# Patient Record
Sex: Female | Born: 1950 | Race: White | Hispanic: No | Marital: Single | Smoking: Never smoker
Health system: Southern US, Community
[De-identification: ages and names within clinical notes are randomized; demographics above are authoritative.]

## PROBLEM LIST (undated history)

## (undated) DIAGNOSIS — Z8489 Family history of other specified conditions: Secondary | ICD-10-CM

## (undated) DIAGNOSIS — R112 Nausea with vomiting, unspecified: Secondary | ICD-10-CM

## (undated) DIAGNOSIS — I10 Essential (primary) hypertension: Secondary | ICD-10-CM

## (undated) DIAGNOSIS — Z87442 Personal history of urinary calculi: Secondary | ICD-10-CM

## (undated) DIAGNOSIS — T8859XA Other complications of anesthesia, initial encounter: Secondary | ICD-10-CM

## (undated) DIAGNOSIS — L409 Psoriasis, unspecified: Secondary | ICD-10-CM

## (undated) HISTORY — DX: Psoriasis, unspecified: L40.9

---

## 1975-07-11 HISTORY — PX: TUBAL LIGATION: SHX77

## 1994-07-10 HISTORY — PX: APPENDECTOMY: SHX54

## 1997-07-10 HISTORY — PX: CHOLECYSTECTOMY: SHX55

## 1998-04-09 ENCOUNTER — Ambulatory Visit (HOSPITAL_COMMUNITY): Admission: RE | Admit: 1998-04-09 | Discharge: 1998-04-09 | Payer: Self-pay | Admitting: Dermatology

## 1998-04-09 ENCOUNTER — Encounter: Payer: Self-pay | Admitting: Dermatology

## 2002-03-11 ENCOUNTER — Ambulatory Visit (HOSPITAL_COMMUNITY): Admission: RE | Admit: 2002-03-11 | Discharge: 2002-03-11 | Payer: Self-pay | Admitting: Dermatology

## 2002-03-11 ENCOUNTER — Encounter (INDEPENDENT_AMBULATORY_CARE_PROVIDER_SITE_OTHER): Payer: Self-pay | Admitting: Specialist

## 2002-03-11 ENCOUNTER — Encounter: Payer: Self-pay | Admitting: Dermatology

## 2005-08-22 ENCOUNTER — Ambulatory Visit (HOSPITAL_COMMUNITY): Admission: RE | Admit: 2005-08-22 | Discharge: 2005-08-22 | Payer: Self-pay | Admitting: Dermatology

## 2005-08-22 ENCOUNTER — Encounter (INDEPENDENT_AMBULATORY_CARE_PROVIDER_SITE_OTHER): Payer: Self-pay | Admitting: Specialist

## 2005-08-26 ENCOUNTER — Emergency Department (HOSPITAL_COMMUNITY): Admission: EM | Admit: 2005-08-26 | Discharge: 2005-08-26 | Payer: Self-pay | Admitting: Emergency Medicine

## 2005-09-01 ENCOUNTER — Ambulatory Visit (HOSPITAL_COMMUNITY): Admission: RE | Admit: 2005-09-01 | Discharge: 2005-09-02 | Payer: Self-pay | Admitting: Surgery

## 2005-09-01 ENCOUNTER — Encounter (INDEPENDENT_AMBULATORY_CARE_PROVIDER_SITE_OTHER): Payer: Self-pay | Admitting: Specialist

## 2008-07-10 HISTORY — PX: MENISCUS REPAIR: SHX5179

## 2009-05-24 ENCOUNTER — Encounter: Admission: RE | Admit: 2009-05-24 | Discharge: 2009-05-24 | Payer: Self-pay | Admitting: Family Medicine

## 2009-10-19 ENCOUNTER — Ambulatory Visit (HOSPITAL_COMMUNITY): Admission: RE | Admit: 2009-10-19 | Discharge: 2009-10-19 | Payer: Self-pay | Admitting: General Practice

## 2010-09-28 LAB — PROTIME-INR: Prothrombin Time: 13 seconds (ref 11.6–15.2)

## 2010-09-28 LAB — CBC
HCT: 42.4 % (ref 36.0–46.0)
Platelets: 163 10*3/uL (ref 150–400)
RDW: 15.5 % (ref 11.5–15.5)

## 2010-09-28 LAB — APTT: aPTT: 31 seconds (ref 24–37)

## 2010-11-25 NOTE — Op Note (Signed)
NAMEABREE, ROMICK              ACCOUNT NO.:  [id number]   MEDICAL RECORD NO.:  [id number]          PATIENT TYPE:  OIB   LOCATION:  5703                         FACILITY:  [facility]   PHYSICIAN:  Wilmon Arms. Corliss Skains, M.D. DATE OF BIRTH:  June 13, 1951   DATE OF PROCEDURE:  09/01/2005  DATE OF DISCHARGE:  09/02/2005                                 OPERATIVE REPORT   PREOPERATIVE DIAGNOSIS:  Chronic calculus cholecystitis.   POSTOPERATIVE DIAGNOSIS:  Chronic calculus cholecystitis.   PROCEDURE PERFORMED:  Laparoscopic cholecystectomy and intraoperative  cholangiogram.   SURGEON:  Wilmon Arms. Tsuei, MD.   ANESTHESIA:  General endotracheal.   INDICATIONS:  The patient is a 60 year old female with a past medical  history significant for psoriasis on methotrexate treatment.  She presents  with a right upper quadrant pain after a recent liver biopsy.  This was  severe and she presented to the emergency department.  A CT scan showed no  evidence of bleeding from the liver biopsy.  She did have multiple calcified  gallstones with a distended gallbladder.  There was no evidence of  cholecystitis.  The patient was then referred for evaluation.  She was seen  earlier this week and was fairly uncomfortable with right upper quadrant  pain.  She has also had some diarrhea.  I started her on oral antibiotics  and posted her for surgery today.   DESCRIPTION OF PROCEDURE:  The patient was brought to the operating room and  placed in the supine position on the operating room table.  After an  adequate level of general endotracheal anesthesia was obtained, the  patient's abdomen was prepped with Betadine and draped in a sterile fashion.  An area below her umbilicus was infiltrated with quarter-percent Marcaine,  and a curvilinear incision was made.  Dissection was carried down to the  fascia, which was opened vertically.  The peritoneal cavity was entered.  A  #0 Vicryl pursestring suture was placed  around the opening.  This was used  to secure the [facility] cannula, and pneumoperitoneum was obtained by  insufflating with CO2 gas maintaining a maximum pressure of 15 mmHg.  The  patient was rotated into the reverse Trendelenburg position slightly to her  left.  A 10-mm port was placed in the subxiphoid position and two 5-mm ports  in the right upper quadrant.  The gallbladder was grasped with a clamp and  elevated up over the edge of the liver.  Some adhesions were taken down.  The peritoneum at the hilum of the gallbladder was opened.  The cystic duct  was circumferentially dissected and ligated with a clip distally.  A small  opening was created on the cystic duct, and a cholangiogram catheter was  inserted through a stab incision into the cystic duct.  This was secured  with a clip.  Our initial cholangiogram showed a small filling defect  floating in the proximal biliary system.  This was round and floated fairly  quickly.  This was felt to be an air bubble.  We then repeated a  cholangiogram, and a filling defect was no longer  noted.  The cholangiogram  catheter was removed, and the cystic duct was ligated with clips and  divided.  The cystic artery was also ligated with clips and divided.  An  anterior branch was ligated with clips and divided.  Cautery was then used  to remove the gallbladder from the liver bed.  The gallbladder was placed in  an EndoCatch sac.  Hemostasis was obtained with cautery.  The gallbladder  was removed through the umbilical incision.  The pursestring suture was used  to close the fascia.  We re-inspected the right upper quadrant and no  bleeding was noted.  The pneumoperitoneum was released, the ports were  removed under direct vision.  A #4-0 Monocryl was used to  close the skin in a subcuticular fashion.  Steri-Strips and clean dressings  were applied.  The patient was then extubated and brought to the recovery  room in stable condition.  All sponge,  instrument, and needle counts were  correct.      Wilmon Arms. Tsuei, M.D.  Electronically Signed     MKT/MEDQ  D:  09/01/2005  T:  09/02/2005  Job:  [id number]

## 2011-03-14 ENCOUNTER — Other Ambulatory Visit: Payer: Self-pay | Admitting: Dermatology

## 2012-10-29 ENCOUNTER — Other Ambulatory Visit: Payer: Self-pay | Admitting: Gastroenterology

## 2013-04-18 ENCOUNTER — Other Ambulatory Visit: Payer: Self-pay | Admitting: Family Medicine

## 2013-04-18 DIAGNOSIS — R9389 Abnormal findings on diagnostic imaging of other specified body structures: Secondary | ICD-10-CM

## 2013-04-21 ENCOUNTER — Ambulatory Visit
Admission: RE | Admit: 2013-04-21 | Discharge: 2013-04-21 | Disposition: A | Discharge status: Home / Self Care | Payer: 59 | Source: Ambulatory Visit | Attending: Family Medicine | Admitting: Family Medicine

## 2013-04-21 DIAGNOSIS — R9389 Abnormal findings on diagnostic imaging of other specified body structures: Secondary | ICD-10-CM

## 2013-04-21 MED ORDER — IOHEXOL 300 MG/ML  SOLN
75.0000 mL | Freq: Once | INTRAMUSCULAR | Status: AC | PRN
  Administered 2013-04-21: 75 mL via INTRAVENOUS

## 2014-08-17 ENCOUNTER — Other Ambulatory Visit: Payer: Self-pay | Admitting: Obstetrics and Gynecology

## 2014-08-18 LAB — CYTOLOGY - PAP

## 2015-02-24 ENCOUNTER — Other Ambulatory Visit: Payer: Self-pay | Admitting: Family Medicine

## 2015-02-24 DIAGNOSIS — R1013 Epigastric pain: Secondary | ICD-10-CM

## 2015-03-05 ENCOUNTER — Ambulatory Visit
Admission: RE | Admit: 2015-03-05 | Discharge: 2015-03-05 | Disposition: A | Discharge status: Home / Self Care | Payer: 59 | Source: Ambulatory Visit | Attending: Family Medicine | Admitting: Family Medicine

## 2015-03-05 DIAGNOSIS — R1013 Epigastric pain: Secondary | ICD-10-CM

## 2015-03-05 MED ORDER — IOPAMIDOL (ISOVUE-300) INJECTION 61%
100.0000 mL | Freq: Once | INTRAVENOUS | Status: AC | PRN
  Administered 2015-03-05: 100 mL via INTRAVENOUS

## 2015-09-17 ENCOUNTER — Other Ambulatory Visit: Payer: Self-pay | Admitting: Family Medicine

## 2015-09-17 DIAGNOSIS — R109 Unspecified abdominal pain: Secondary | ICD-10-CM

## 2015-09-21 ENCOUNTER — Other Ambulatory Visit: Payer: Self-pay | Admitting: Family Medicine

## 2015-09-21 DIAGNOSIS — R109 Unspecified abdominal pain: Secondary | ICD-10-CM

## 2015-09-29 ENCOUNTER — Ambulatory Visit
Admission: RE | Admit: 2015-09-29 | Discharge: 2015-09-29 | Disposition: A | Discharge status: Home / Self Care | Payer: 59 | Source: Ambulatory Visit | Attending: Family Medicine | Admitting: Family Medicine

## 2015-09-29 ENCOUNTER — Ambulatory Visit
Admission: RE | Admit: 2015-09-29 | Discharge: 2015-09-29 | Disposition: A | Discharge status: Home / Self Care | Payer: Medicare Other | Source: Ambulatory Visit | Attending: Family Medicine | Admitting: Family Medicine

## 2015-09-29 DIAGNOSIS — R109 Unspecified abdominal pain: Secondary | ICD-10-CM

## 2017-07-31 IMAGING — US US ABDOMEN COMPLETE
1 series · 14 of 25 positions shown · non-contrast
Comparison: 03/05/2015 CT with contrast

CLINICAL DATA: Acute left flank pain, prior cholecystectomy and
appendectomy.

EXAM:
ABDOMEN ULTRASOUND COMPLETE

[Series 1: us abdomen complete · 0.29mm/px · 14 of 74 slices shown]
[im 1/74]
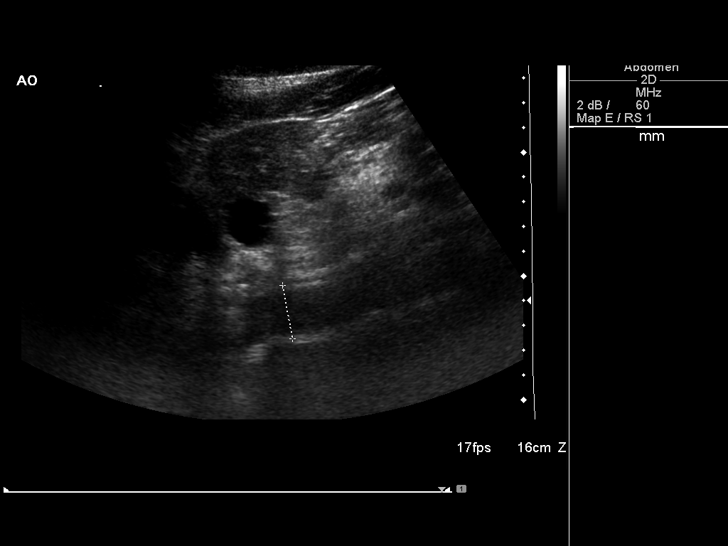
[im 7/74]
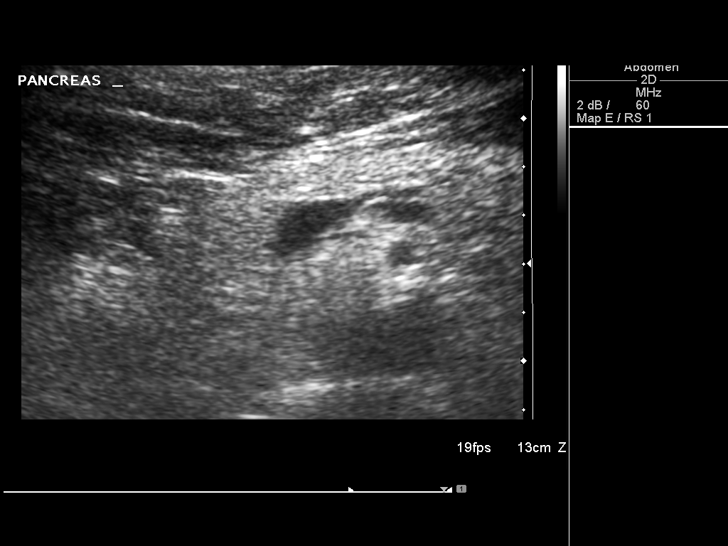
[im 13/74]
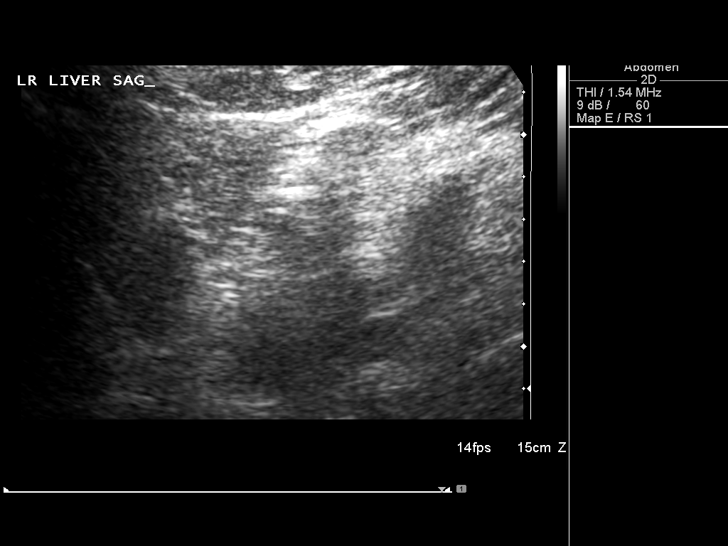
[im 19/74]
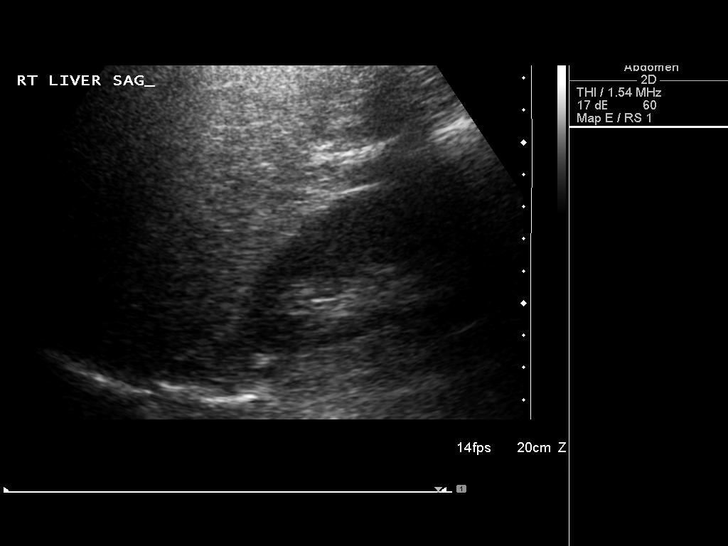
[im 25/74]
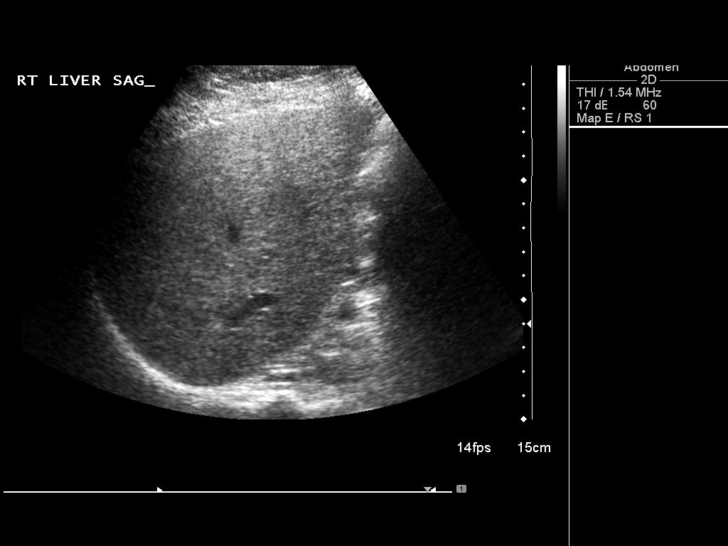
[im 28/74]
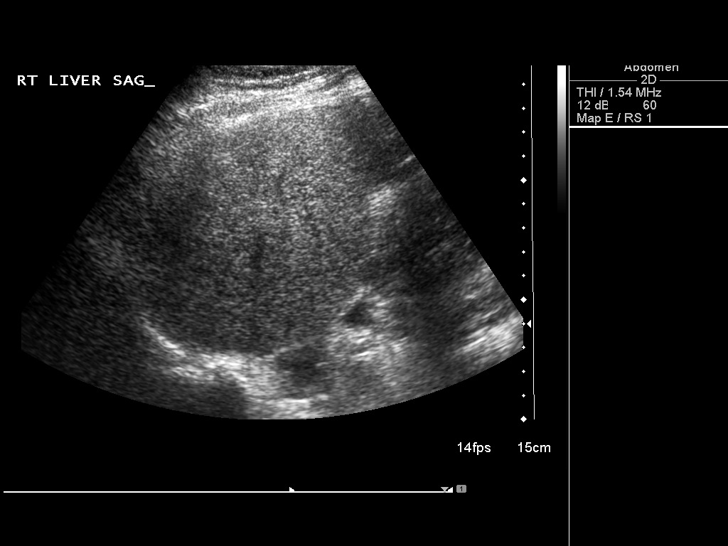
[im 34/74]
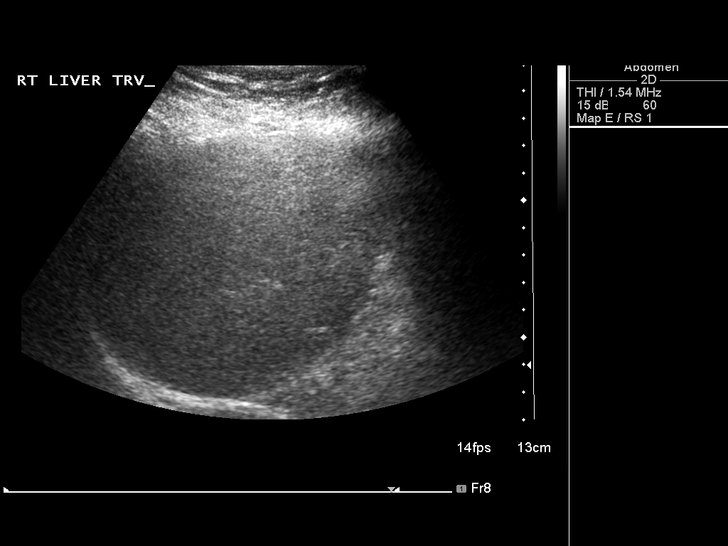
[im 40/74]
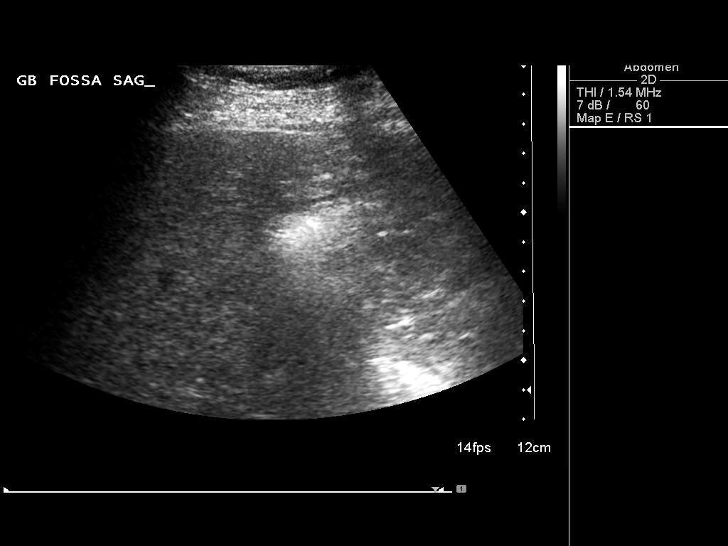
[im 46/74]
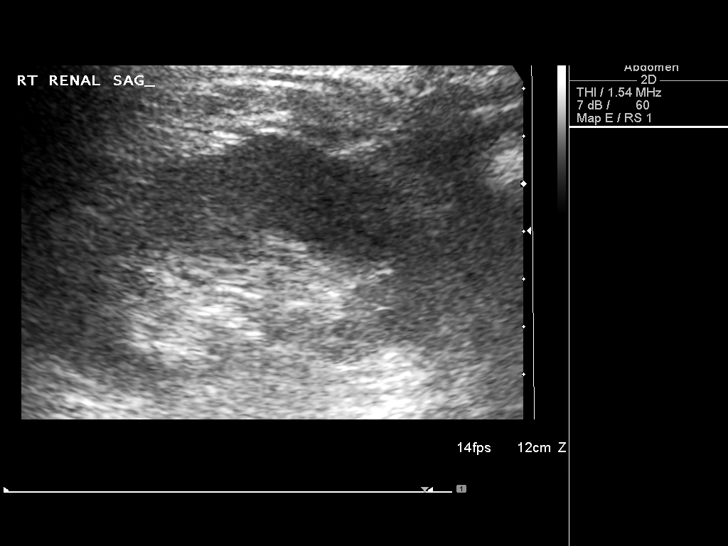
[im 49/74]
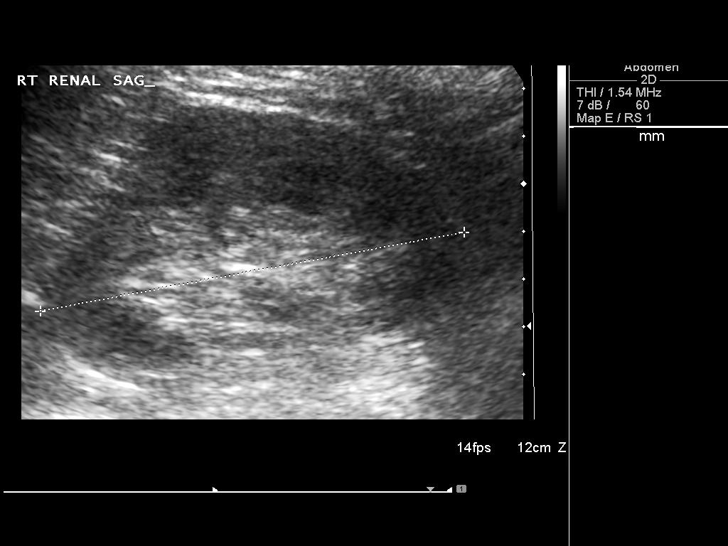
[im 55/74]
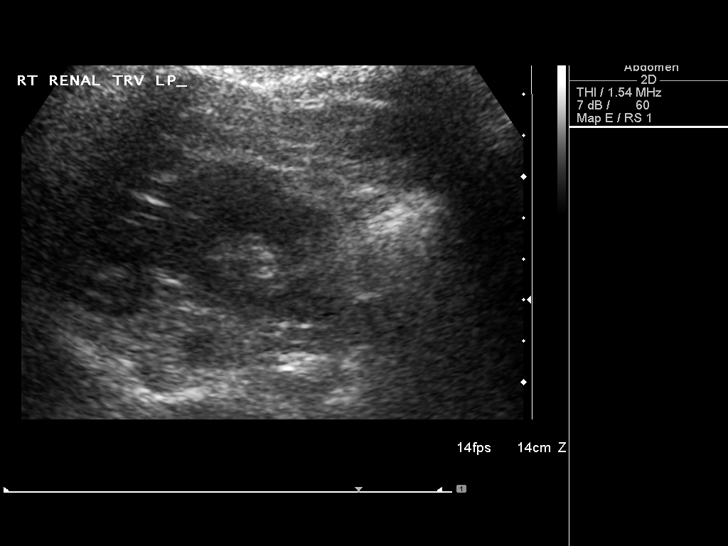
[im 61/74]
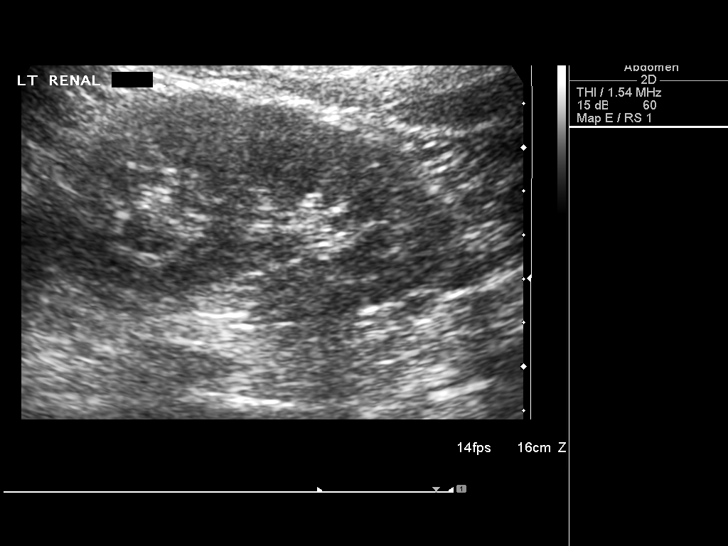
[im 67/74]
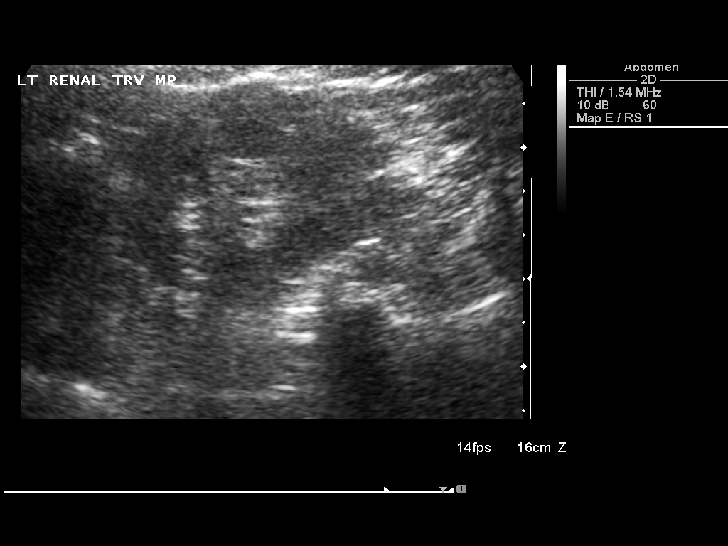
[im 74/74]
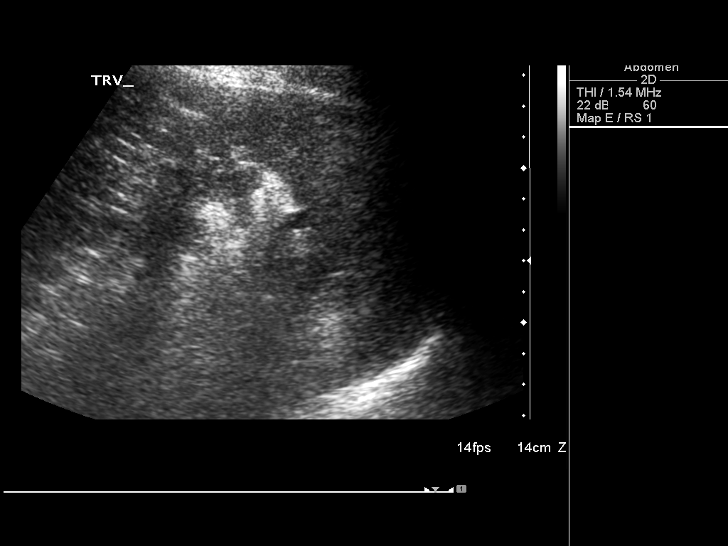

[14 of 25 positions shown; findings below may reference images not displayed]

FINDINGS: Gallbladder: Surgically absent

Common bile duct: Diameter: 5 mm

Liver: Mildly heterogeneous echogenicity without definite focal
abnormality or intrahepatic biliary dilatation. Left hepatic cysts
in the lateral segment measures 18 x 21 x 21 mm, correlates with the
prior CT. No other hepatic abnormality.

IVC: No abnormality visualized.

Pancreas: Visualized portion unremarkable.

Spleen: Size and appearance within normal limits.

Right Kidney: Length: 9.1 cm. Echogenicity within normal limits. No
mass or hydronephrosis visualized.

Left Kidney: Length: 9.6 cm. Echogenicity within normal limits. No
mass or hydronephrosis visualized.

Abdominal aorta: No aneurysm visualized.

Other findings: No free fluid
IMPRESSION: Prior cholecystectomy.  No acute finding or biliary dilatation.

2.1 cm left hepatic cyst

## 2017-07-31 IMAGING — US US PELVIS COMPLETE
1 series · 13 of 25 positions shown · non-contrast
Comparison: None in PACs

CLINICAL DATA: One week history of left flank pain, protein urea.



[Series 1: us pelvis complete · 0.30mm/px · 13 of 61 slices shown]
[im 1/61]
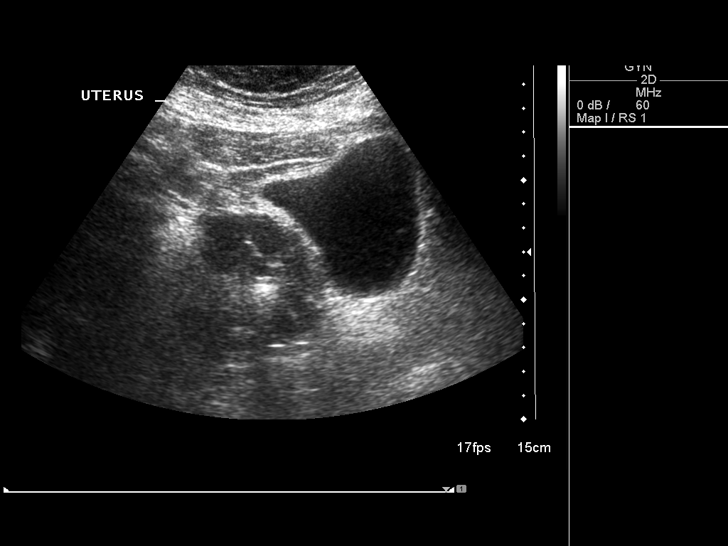
[im 6/61]
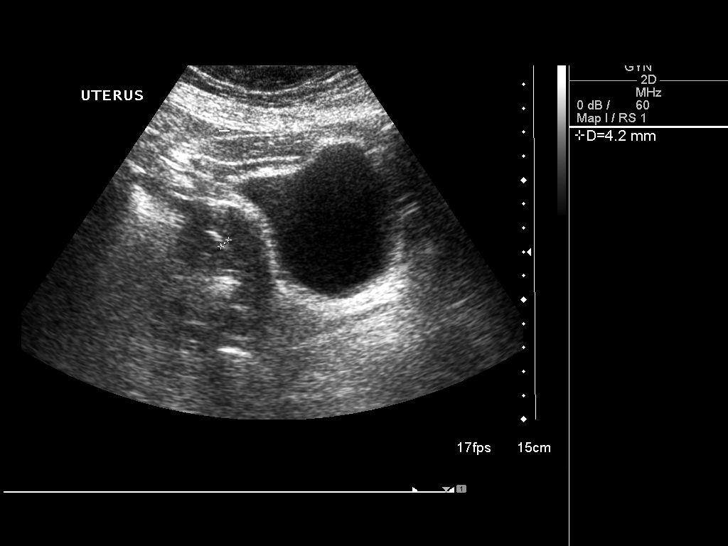
[im 11/61]
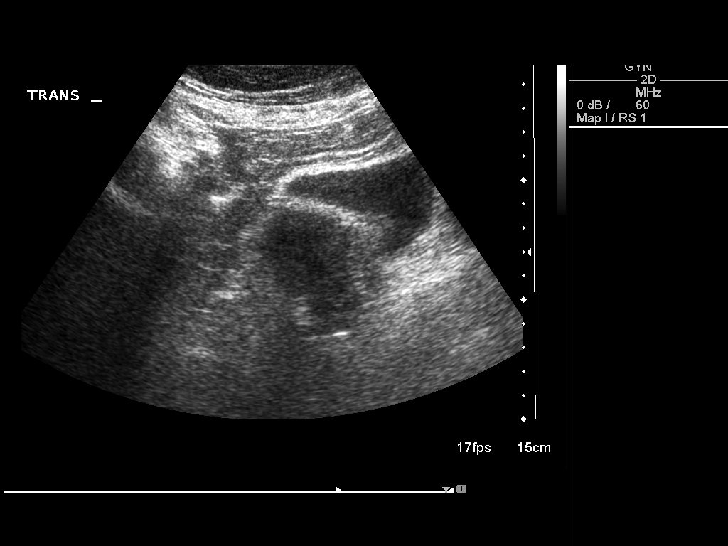
[im 16/61]
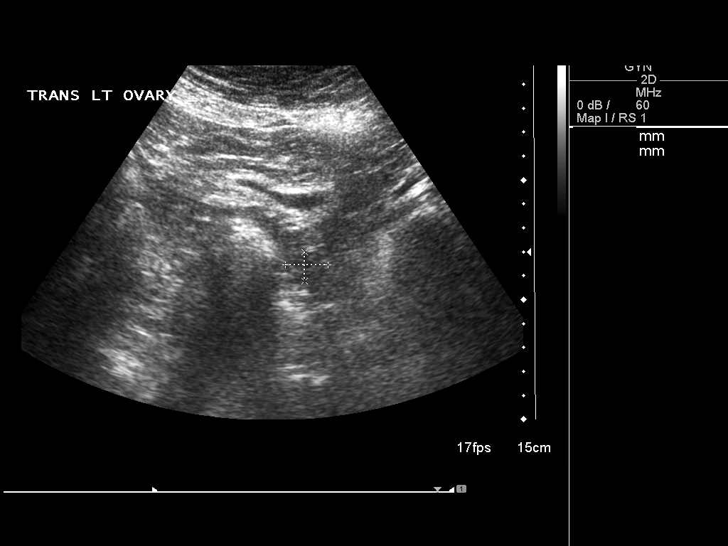
[im 21/61]
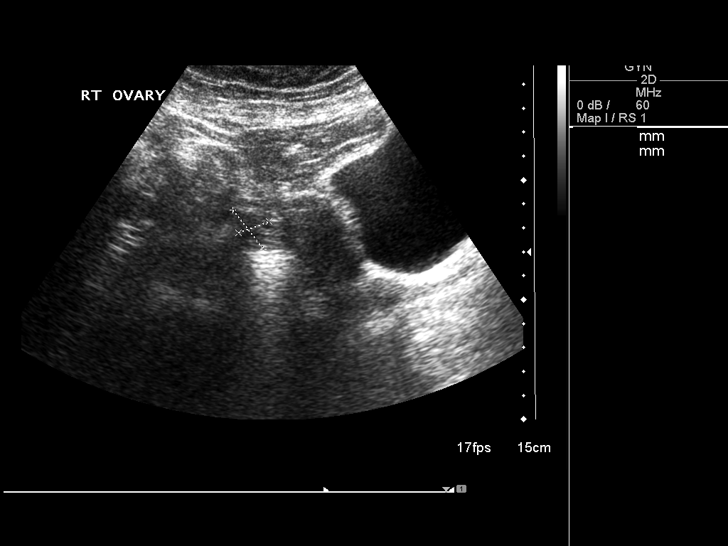
[im 26/61]
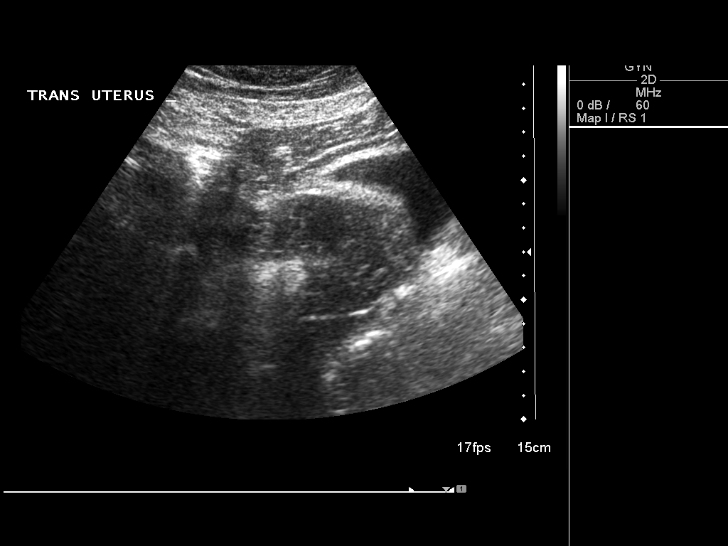
[im 31/61]
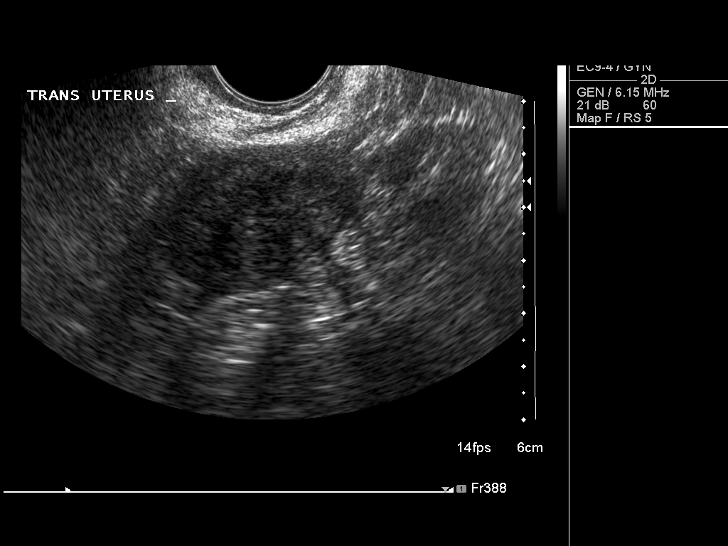
[im 36/61]
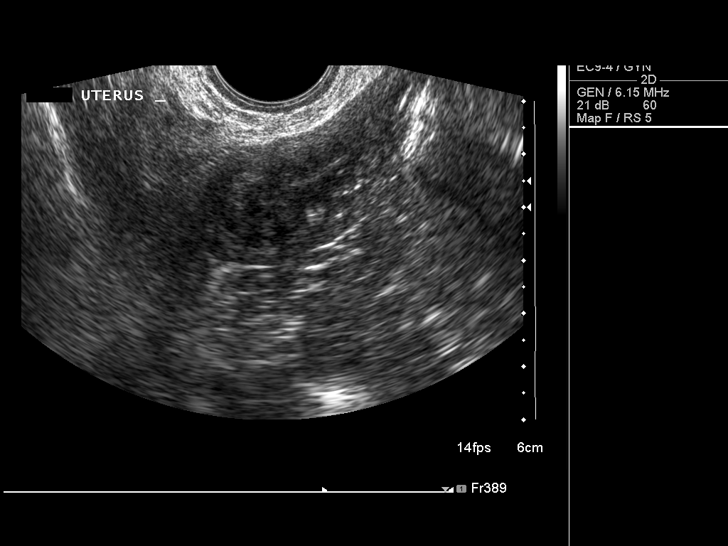
[im 41/61]
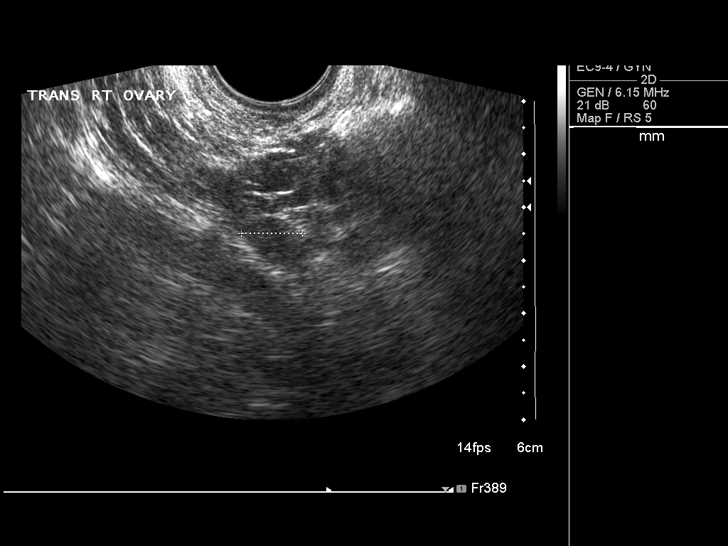
[im 46/61]
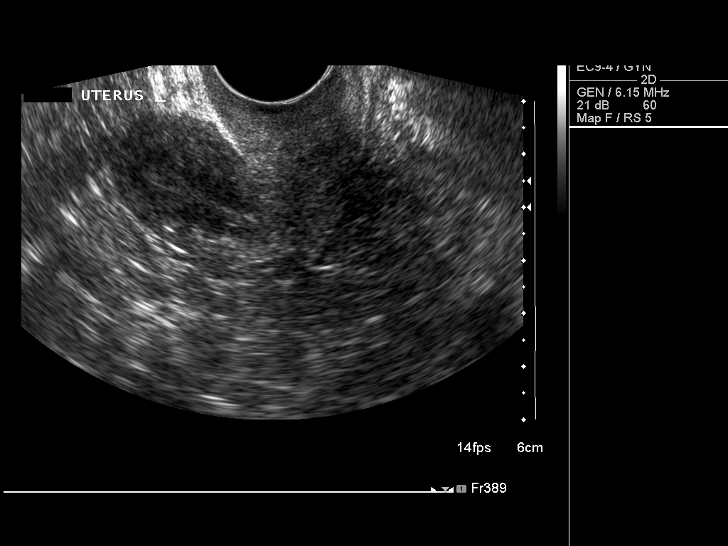
[im 51/61]
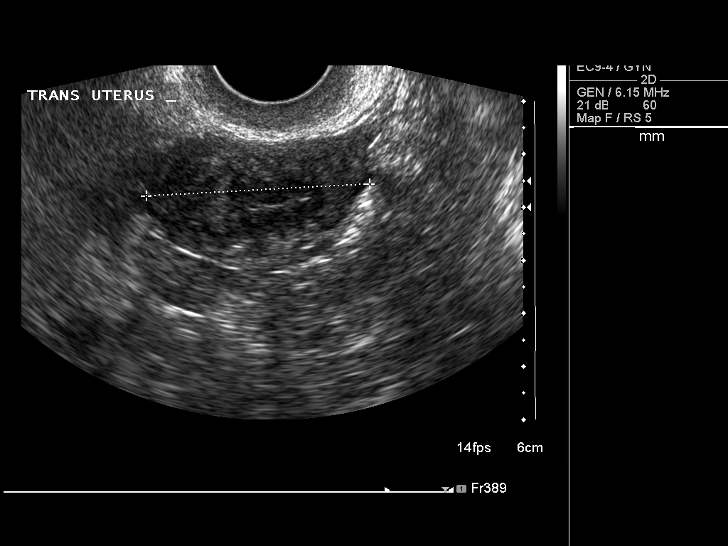
[im 56/61]
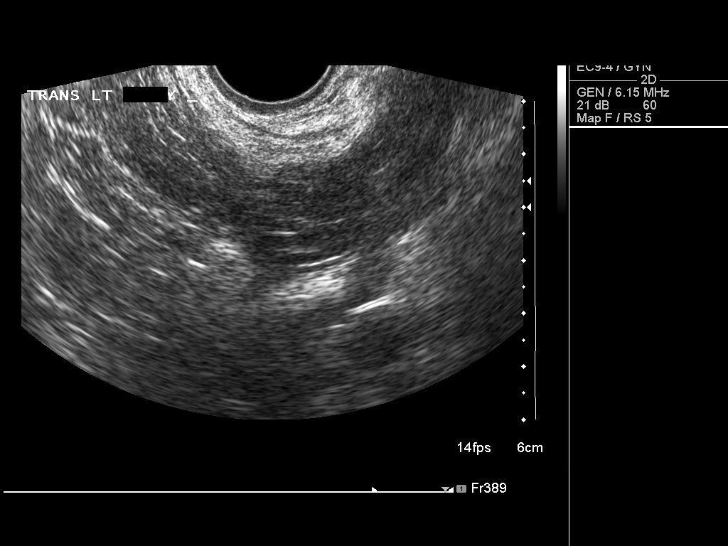
[im 61/61]
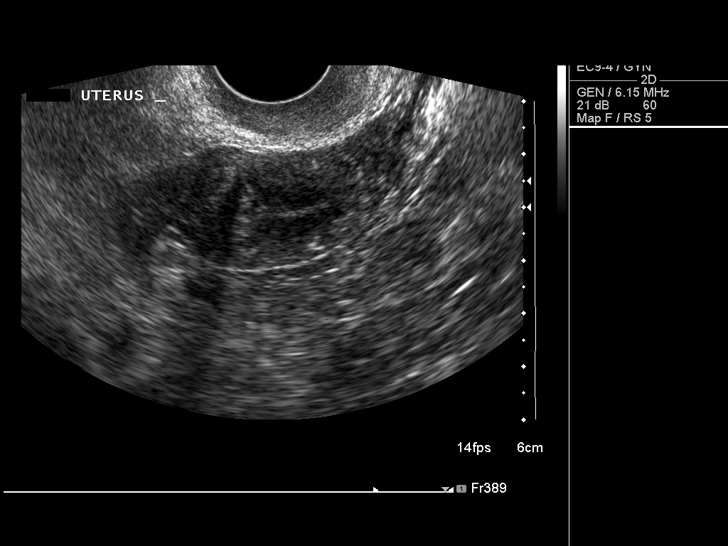

[13 of 25 positions shown; findings below may reference images not displayed]

FINDINGS: Uterus

Measurements: 6.8 x 3.0 x 4.2 cm. There is a posterior right-sided
fundal fibroid measuring 1.7 cm in greatest dimension.

Endometrium

Thickness: 4 mm.  No focal abnormality visualized.

Right ovary

Measurements: 1.6 x 0.8 x 1.2 cm. Normal appearance/no adnexal mass.

Left ovary

Measurements: 1.3 x 0.7 x 1 cm. Normal appearance/no adnexal mass.

Other findings

There is no free pelvic fluid. The urinary bladder is unremarkable
where visualized.
IMPRESSION: 1. 1.7 cm maximal diameter right-sided posterior fundal fibroid. The
endometrial stripe is normal.
2. Normal appearance of the ovaries.  There is no free pelvic fluid.

## 2019-09-25 ENCOUNTER — Other Ambulatory Visit: Payer: Self-pay | Admitting: Dermatology

## 2019-09-27 LAB — QUANTIFERON-TB GOLD PLUS
Mitogen-NIL: 6.64 IU/mL
NIL: 0.09 IU/mL
QuantiFERON-TB Gold Plus: NEGATIVE
TB1-NIL: <0 IU/mL
TB2-NIL: <0 IU/mL

## 2019-10-06 ENCOUNTER — Telehealth: Payer: Self-pay | Admitting: Dermatology

## 2019-10-06 NOTE — Telephone Encounter (Signed)
Patient was given TB Gold test results (Negative), she is now able to make nurse visit to get her Ilumya injection. Nurse visit was made for this Wednesday at 2:45 the [place] has been shipped to the office.

## 2019-10-06 NOTE — Telephone Encounter (Signed)
Patient calling for TB test results and next steps for getting Ilumya Injection.

## 2019-10-08 ENCOUNTER — Other Ambulatory Visit: Payer: Self-pay

## 2019-10-08 ENCOUNTER — Ambulatory Visit (INDEPENDENT_AMBULATORY_CARE_PROVIDER_SITE_OTHER): Payer: Medicare Other | Admitting: *Deleted

## 2019-10-08 DIAGNOSIS — L409 Psoriasis, unspecified: Secondary | ICD-10-CM

## 2019-10-08 MED ORDER — TILDRAKIZUMAB-ASMN 100 MG/ML ~~LOC~~ SOSY
100.0000 mg | PREFILLED_SYRINGE | Freq: Once | SUBCUTANEOUS | Status: AC
  Administered 2019-10-08: 100 mg via SUBCUTANEOUS

## 2019-10-08 NOTE — Progress Notes (Signed)
Gave injection Left abdomen

## 2019-10-14 ENCOUNTER — Encounter: Payer: Self-pay | Admitting: *Deleted

## 2019-10-15 NOTE — Progress Notes (Signed)
Went back into chart per Juliann Pulse to add the J-code because she supposedly fixed the error.. however it still rejected J-code  [id number]

## 2019-12-02 ENCOUNTER — Telehealth: Payer: Self-pay | Admitting: Dermatology

## 2019-12-02 NOTE — Telephone Encounter (Signed)
Phone call to Katie Tanner to schedule delivery of the patient's Ilumya injection.  Per [place] they will get the patient's medication delivered tomorrow 12/03/2019.

## 2019-12-02 NOTE — Telephone Encounter (Signed)
Pharmacy is calling to set up shipment of her Ilumiya prescription.  (Chart # [id number])

## 2019-12-11 ENCOUNTER — Ambulatory Visit (INDEPENDENT_AMBULATORY_CARE_PROVIDER_SITE_OTHER): Payer: Medicare Other

## 2019-12-11 ENCOUNTER — Other Ambulatory Visit: Payer: Self-pay

## 2019-12-11 DIAGNOSIS — L409 Psoriasis, unspecified: Secondary | ICD-10-CM

## 2019-12-11 MED ORDER — TILDRAKIZUMAB-ASMN 100 MG/ML ~~LOC~~ SOSY
100.0000 mg | PREFILLED_SYRINGE | Freq: Once | SUBCUTANEOUS | Status: AC
  Administered 2019-12-11: 100 mg via SUBCUTANEOUS

## 2019-12-11 NOTE — Progress Notes (Signed)
Right abdomen injection site ilumya.

## 2019-12-18 ENCOUNTER — Other Ambulatory Visit: Payer: Self-pay

## 2019-12-18 ENCOUNTER — Ambulatory Visit (INDEPENDENT_AMBULATORY_CARE_PROVIDER_SITE_OTHER): Payer: Medicare Other | Admitting: Otolaryngology

## 2019-12-18 ENCOUNTER — Encounter (INDEPENDENT_AMBULATORY_CARE_PROVIDER_SITE_OTHER): Payer: Self-pay | Admitting: Otolaryngology

## 2019-12-18 VITALS — Temp 97.3°F

## 2019-12-18 DIAGNOSIS — H903 Sensorineural hearing loss, bilateral: Secondary | ICD-10-CM

## 2019-12-18 NOTE — Progress Notes (Signed)
HPI: Katie Tanner is a 69 y.o. female who presents is referred by Dr. Drema Dallas for evaluation of hearing complaints.  Patient has noticed a gradual decline in her hearing.  Patient feels like she might be underwater or something is blocking her hearing in her ear canals.  She also complains of some mild tinnitus in both ears.  She also inquires about some small lumps on the inside of her left lower lip.Marland Kitchen  No past medical history on file. No past surgical history on file. Social History   Socioeconomic History   Marital status: Single    Spouse name: Not on file   Number of children: Not on file   Years of education: Not on file   Highest education level: Not on file  Occupational History   Not on file  Tobacco Use   Smoking status: Never Smoker   Smokeless tobacco: Never Used  Substance and Sexual Activity   Alcohol use: Not on file   Drug use: Not on file   Sexual activity: Not on file  Other Topics Concern   Not on file  Social History Narrative   Not on file   Social Determinants of Health   Financial Resource Strain:    Difficulty of Paying Living Expenses:   Food Insecurity:    Worried About [place] in the Last Year:    [place] in the Last Year:   Transportation Needs:    Film/video editor (Medical):    Lack of Transportation (Non-Medical):   Physical Activity:    Days of Exercise per Week:    Minutes of Exercise per Session:   Stress:    Feeling of Stress :   Social Connections:    Frequency of Communication with Friends and Family:    Frequency of Social Gatherings with Friends and Family:    Attends Religious Services:    Active Member of Clubs or Organizations:    Attends Archivist Meetings:    Marital Status:    No family history on file. No Known Allergies Prior to Admission medications   Not on File     Positive ROS: Otherwise negative  All other systems have been reviewed and were  otherwise negative with the exception of those mentioned in the HPI and as above.  Physical Exam: Constitutional: Alert, well-appearing, no acute distress Ears: External ears without lesions or tenderness.  Ear canals are clear bilaterally.  TMs are clear bilaterally with good mobility on pneumatic otoscopy. Nasal: External nose without lesions. Clear nasal passages Oral: She has some small nodules on the inside the left lower lip consistent with minor salivary glands or perhaps very small mucoceles.  These are soft to palpation and are not indurated.. Tongue and palate mucosa without lesions. Posterior oropharynx clear. Neck: No palpable adenopathy or masses. Respiratory: Breathing comfortably  Skin: No facial/neck lesions or rash noted.  Audiogram in the office today demonstrated bilateral sensorineural hearing loss in both ears.  On the right side her hearing range from 20 DB in the lower frequencies to 50 DB in the higher frequencies.  On the left side hearing started at 40 DB in the lower frequencies ranging up to 20 DB in the upper frequencies.  SRT's were symmetric in both ears at 30 dB bilaterally.  She had type a tympanograms bilaterally.  Procedures  Assessment: Bilateral sensorineural hearing loss.  Plan: I discussed with her that she would be a candidate for hearing aids and suggested  using either [facility] or [employer]. Concerning the nodules on the left lower lip these are consistent with minor salivary gland mucoceles and reassured her of normal evaluation otherwise.   Radene Journey, MD   CC:

## 2019-12-22 ENCOUNTER — Encounter (INDEPENDENT_AMBULATORY_CARE_PROVIDER_SITE_OTHER): Payer: Self-pay

## 2020-01-14 ENCOUNTER — Ambulatory Visit: Payer: Medicare Other | Admitting: Dermatology

## 2020-01-14 ENCOUNTER — Other Ambulatory Visit: Payer: Self-pay

## 2020-01-14 DIAGNOSIS — D485 Neoplasm of uncertain behavior of skin: Secondary | ICD-10-CM

## 2020-01-14 DIAGNOSIS — D225 Melanocytic nevi of trunk: Secondary | ICD-10-CM | POA: Diagnosis not present

## 2020-01-14 DIAGNOSIS — L570 Actinic keratosis: Secondary | ICD-10-CM | POA: Diagnosis not present

## 2020-01-14 DIAGNOSIS — D229 Melanocytic nevi, unspecified: Secondary | ICD-10-CM

## 2020-01-14 NOTE — Patient Instructions (Signed)

## 2020-02-02 ENCOUNTER — Encounter: Payer: Self-pay | Admitting: Dermatology

## 2020-02-02 NOTE — Progress Notes (Signed)
   Follow-Up Visit   Subjective  Katie Tanner is a 69 y.o. female who presents for the following: Skin Problem (Check spot on right cheek and a spot on the right breast. ).  Growth Location: Right breast Duration: Months Quality: Larger Associated Signs/Symptoms: Some itch Modifying Factors:  Severity:  Timing: Context:   Objective  Well appearing patient in no apparent distress; mood and affect are within normal limits.  All sun exposed areas plus back examined. Plus  Chest.   Assessment & Plan    Neoplasm of uncertain behavior of skin Right Breast  Skin / nail biopsy Type of biopsy: tangential   Informed consent: discussed and consent obtained   Timeout: patient name, date of birth, surgical site, and procedure verified   Procedure prep:  Patient was prepped and draped in usual sterile fashion Prep type:  Chlorhexidine Anesthesia: the lesion was anesthetized in a standard fashion   Anesthetic:  1% lidocaine w/ epinephrine 1-100,000 local infiltration Instrument used: flexible razor blade   Hemostasis achieved with: ferric subsulfate   Outcome: patient tolerated procedure well   Post-procedure details: wound care instructions given    Specimen 1 - Surgical pathology Differential Diagnosis: Rule out SK Check Margins: No  AK (actinic keratosis) Right Malar Cheek  Destruction of lesion - Right Malar Cheek  Destruction method: cryotherapy   Informed consent: discussed and consent obtained   Timeout:  patient name, date of birth, surgical site, and procedure verified Lesion destroyed using liquid nitrogen: Yes   Region frozen until ice ball extended beyond lesion: Yes   Cryotherapy cycles:  5 Outcome: patient tolerated procedure well with no complications    Nevus Mid Back  Annual skin examination     I, Lavonna Monarch, MD, have reviewed all documentation for this visit.  The documentation on 02/02/20 for the exam, diagnosis, procedures, and orders are  all accurate and complete.

## 2020-03-03 ENCOUNTER — Other Ambulatory Visit: Payer: Self-pay

## 2020-03-03 ENCOUNTER — Ambulatory Visit (INDEPENDENT_AMBULATORY_CARE_PROVIDER_SITE_OTHER): Payer: Medicare Other | Admitting: *Deleted

## 2020-03-03 DIAGNOSIS — L409 Psoriasis, unspecified: Secondary | ICD-10-CM | POA: Diagnosis not present

## 2020-03-03 MED ORDER — TILDRAKIZUMAB-ASMN 100 MG/ML ~~LOC~~ SOSY
100.0000 mg | PREFILLED_SYRINGE | Freq: Once | SUBCUTANEOUS | Status: AC
  Administered 2020-03-03: 100 mg via SUBCUTANEOUS

## 2020-03-03 NOTE — Progress Notes (Signed)
Here for Ilumya injection.  Injected 100 mg in the left abdomen. No concerns. Patient tolerated well.   EXP 02/2022 LOT # [id number] [place]- [phone]

## 2020-03-05 NOTE — Addendum Note (Signed)
Addended by: Ailene Rud on: 03/05/2020 08:35 AM   Modules accepted: Level of Service

## 2020-05-18 ENCOUNTER — Telehealth: Payer: Self-pay

## 2020-05-18 NOTE — Telephone Encounter (Signed)
Fax received from [place] stating that the patient's Ilumya will be shipped to our office.

## 2020-06-08 ENCOUNTER — Other Ambulatory Visit: Payer: Self-pay

## 2020-06-08 ENCOUNTER — Ambulatory Visit (INDEPENDENT_AMBULATORY_CARE_PROVIDER_SITE_OTHER): Payer: Medicare Other

## 2020-06-08 DIAGNOSIS — L409 Psoriasis, unspecified: Secondary | ICD-10-CM | POA: Diagnosis not present

## 2020-06-08 DIAGNOSIS — L400 Psoriasis vulgaris: Secondary | ICD-10-CM | POA: Diagnosis not present

## 2020-06-08 MED ORDER — TILDRAKIZUMAB-ASMN 100 MG/ML ~~LOC~~ SOSY
100.0000 mg | PREFILLED_SYRINGE | Freq: Once | SUBCUTANEOUS | Status: AC
  Administered 2020-06-08: 100 mg via SUBCUTANEOUS

## 2020-06-08 NOTE — Progress Notes (Signed)
Right abdomen tolerated well

## 2020-07-22 ENCOUNTER — Telehealth: Payer: Self-pay

## 2020-07-22 NOTE — Telephone Encounter (Signed)
Ilumya enrollment form filled out and faxed to [facility] support.

## 2020-08-13 ENCOUNTER — Telehealth: Payer: Self-pay

## 2020-08-13 NOTE — Telephone Encounter (Signed)
Phone call to patient to inform her that we need proof of her income and household size for her Ilumya support.  Patient states that she will mail her and her husbands income to [place].

## 2020-08-16 ENCOUNTER — Telehealth: Payer: Self-pay

## 2020-08-16 NOTE — Telephone Encounter (Signed)
Phone call to patient to get her email address so she can sign the Ilumya support paperwork.  Patient gave me her email address [email or web address]

## 2020-08-16 NOTE — Telephone Encounter (Signed)
Phone call from [employer] [facility] the [place] stating that for them to continue to process the patient's Ilumya they need a signature.  I explained to Jacqulynn Cadet the reason why I faxed the forms without the patient's signature was because according to [facility] support they didn't need a new signature for the patient.  Per Jacqulynn Cadet I was given the wrong information and the patient's signature is needed. Per Jacqulynn Cadet they can send an email to the patient for her to sign the paperwork. I place Jacqulynn Cadet on hold to call the patient and get her email address.  I gave Jacqulynn Cadet the email address.

## 2020-08-16 NOTE — Telephone Encounter (Signed)
Faxed income today to [place] support fax [phone]

## 2020-08-17 ENCOUNTER — Telehealth: Payer: Self-pay

## 2020-08-17 NOTE — Telephone Encounter (Signed)
Phone call from [facility] support stating the patient doesn't qualify for patient assistance due to her income.

## 2020-08-17 NOTE — Telephone Encounter (Signed)
Patient returning our call- informed patient that she doesn't qualify to patient assistance.  Patient understood

## 2020-08-23 ENCOUNTER — Other Ambulatory Visit: Payer: Self-pay | Admitting: Dermatology

## 2020-09-07 ENCOUNTER — Other Ambulatory Visit: Payer: Self-pay

## 2020-09-07 ENCOUNTER — Ambulatory Visit (INDEPENDENT_AMBULATORY_CARE_PROVIDER_SITE_OTHER): Payer: Medicare Other

## 2020-09-07 DIAGNOSIS — L400 Psoriasis vulgaris: Secondary | ICD-10-CM | POA: Diagnosis not present

## 2020-09-07 MED ORDER — TILDRAKIZUMAB-ASMN 100 MG/ML ~~LOC~~ SOSY
100.0000 mg | PREFILLED_SYRINGE | Freq: Once | SUBCUTANEOUS | Status: AC
  Administered 2020-09-07: 100 mg via SUBCUTANEOUS

## 2020-09-07 NOTE — Progress Notes (Signed)
Here for Ilumya injection right abdomen, patient tolerated well.

## 2020-11-29 ENCOUNTER — Telehealth: Payer: Self-pay | Admitting: Dermatology

## 2020-11-29 NOTE — Telephone Encounter (Signed)
[place] left a message on office voice mail that patient's medication is ready for delivery to th office and to call [phone] to set up delivery.

## 2020-12-02 NOTE — Telephone Encounter (Signed)
Pharmacy set up delivery for May 31st between 7am and 4:30 PM

## 2020-12-08 ENCOUNTER — Ambulatory Visit: Payer: Medicare Other

## 2020-12-09 ENCOUNTER — Ambulatory Visit: Payer: Medicare Other

## 2020-12-13 ENCOUNTER — Other Ambulatory Visit: Payer: Self-pay

## 2020-12-13 ENCOUNTER — Ambulatory Visit (INDEPENDENT_AMBULATORY_CARE_PROVIDER_SITE_OTHER): Payer: Medicare Other

## 2020-12-13 DIAGNOSIS — L400 Psoriasis vulgaris: Secondary | ICD-10-CM | POA: Diagnosis not present

## 2020-12-13 MED ORDER — TILDRAKIZUMAB-ASMN 100 MG/ML ~~LOC~~ SOSY
100.0000 mg | PREFILLED_SYRINGE | Freq: Once | SUBCUTANEOUS | Status: AC
  Administered 2020-12-13: 100 mg via SUBCUTANEOUS

## 2020-12-13 NOTE — Progress Notes (Signed)
Here for Ilumya injection, given in left upper abdomen, patient tolerated well.

## 2021-03-21 ENCOUNTER — Ambulatory Visit (INDEPENDENT_AMBULATORY_CARE_PROVIDER_SITE_OTHER): Payer: Medicare Other

## 2021-03-21 ENCOUNTER — Other Ambulatory Visit: Payer: Self-pay

## 2021-03-21 DIAGNOSIS — L400 Psoriasis vulgaris: Secondary | ICD-10-CM | POA: Diagnosis not present

## 2021-03-21 MED ORDER — TILDRAKIZUMAB-ASMN 100 MG/ML ~~LOC~~ SOSY
100.0000 mg | PREFILLED_SYRINGE | Freq: Once | SUBCUTANEOUS | Status: AC
  Administered 2021-03-21: 100 mg via SUBCUTANEOUS

## 2021-03-21 NOTE — Progress Notes (Addendum)
Here for Ilumya injection today, injection given in right upper abdomen patient tolerated well. Patient supplied medication.

## 2021-05-11 ENCOUNTER — Other Ambulatory Visit: Payer: Self-pay | Admitting: Dermatology

## 2021-06-10 ENCOUNTER — Other Ambulatory Visit: Payer: Self-pay | Admitting: Dermatology

## 2021-06-13 ENCOUNTER — Other Ambulatory Visit: Payer: Self-pay | Admitting: *Deleted

## 2021-06-13 ENCOUNTER — Ambulatory Visit: Payer: Medicare Other

## 2021-06-13 ENCOUNTER — Other Ambulatory Visit: Payer: Self-pay

## 2021-06-13 DIAGNOSIS — L400 Psoriasis vulgaris: Secondary | ICD-10-CM

## 2021-06-13 NOTE — Progress Notes (Signed)
Patient is needing TB test and office visit before refills on ilumya. Gave patient order for TB test and office visit was made with Dr.Tafeen.

## 2021-06-14 ENCOUNTER — Encounter: Payer: Self-pay | Admitting: Dermatology

## 2021-06-14 ENCOUNTER — Ambulatory Visit: Payer: Medicare Other | Admitting: Dermatology

## 2021-06-14 DIAGNOSIS — L821 Other seborrheic keratosis: Secondary | ICD-10-CM | POA: Diagnosis not present

## 2021-06-14 DIAGNOSIS — L409 Psoriasis, unspecified: Secondary | ICD-10-CM

## 2021-06-14 MED ORDER — CLOBETASOL PROPIONATE 0.05 % EX FOAM: CUTANEOUS | 2 refills | Status: DC

## 2021-06-16 LAB — QUANTIFERON-TB GOLD PLUS
Mitogen-NIL: >10 IU/mL
NIL: 0.07 IU/mL
QuantiFERON-TB Gold Plus: NEGATIVE
TB1-NIL: <0 IU/mL
TB2-NIL: <0 IU/mL

## 2021-07-03 ENCOUNTER — Encounter: Payer: Self-pay | Admitting: Dermatology

## 2021-07-03 NOTE — Progress Notes (Signed)
° °  Follow-Up Visit   Subjective  Katie Tanner is a 70 y.o. female who presents for the following: Psoriasis (Follow up- tx- llumya- "keeps pretty clear").  Psoriasis skin check plus bilateral hip Location:  Duration:  Quality:  Associated Signs/Symptoms: Modifying Factors:  Severity:  Timing: Context:   Objective  Well appearing patient in no apparent distress; mood and affect are within normal limits. Chest (Upper Torso, Anterior) Patient following up on ilumya she is due for TB test, no current flare just mild itching on the top of the hands. Patient is on meloxicam for joint pain left knee and hands.  Right Hip 5 mm flattopped textured tan papule, compatible dermoscopy    A focused examination was performed including head, neck, back, arms, legs, joints. Relevant physical exam findings are noted in the Assessment and Plan.   Assessment & Plan    Psoriasis Chest (Upper Torso, Anterior)  Again discussed the importance of prioritizing Control of her arthritis.  We will continue Ilumya and use clobetasol for non facial stubborn scaly spots  clobetasol (OLUX) 0.05 % topical foam - Chest (Upper Torso, Anterior) Apply to skin 1-2 times daily not for face or skin folds  Seborrheic keratosis Right Hip  Recheck as needed change      I, Lavonna Monarch, MD, have reviewed all documentation for this visit.  The documentation on 07/03/21 for the exam, diagnosis, procedures, and orders are all accurate and complete.

## 2021-08-01 ENCOUNTER — Telehealth: Payer: Self-pay | Admitting: Dermatology

## 2021-08-01 NOTE — Telephone Encounter (Signed)
Patient is calling to make sure that we have gotten the results from the TB test so that she can get a refill on  Ilumya ILUMYA 100 MG/ML subcutaneous injection

## 2021-08-02 MED ORDER — ILUMYA 100 MG/ML ~~LOC~~ SOSY: PREFILLED_SYRINGE | SUBCUTANEOUS | 1 refills | Status: DC

## 2021-08-02 NOTE — Addendum Note (Signed)
Addended by: Awilda Metro S on: 08/02/2021 11:46 AM   Modules accepted: Orders

## 2021-08-02 NOTE — Telephone Encounter (Signed)
Phone call to patient to let her know we got TB and sent refills to pharmacy.

## 2021-08-09 ENCOUNTER — Ambulatory Visit (INDEPENDENT_AMBULATORY_CARE_PROVIDER_SITE_OTHER): Payer: Medicare Other | Admitting: *Deleted

## 2021-08-09 ENCOUNTER — Other Ambulatory Visit: Payer: Self-pay

## 2021-08-09 DIAGNOSIS — L409 Psoriasis, unspecified: Secondary | ICD-10-CM | POA: Diagnosis not present

## 2021-08-09 MED ORDER — TILDRAKIZUMAB-ASMN 100 MG/ML ~~LOC~~ SOSY
100.0000 mg | PREFILLED_SYRINGE | Freq: Once | SUBCUTANEOUS | Status: AC
  Administered 2021-08-09: 100 mg via SUBCUTANEOUS

## 2021-08-09 NOTE — Progress Notes (Signed)
Here for Ilumya injection.  Tolerated well no concerns.   [id number] Exp- 08/2023

## 2021-11-01 ENCOUNTER — Ambulatory Visit (INDEPENDENT_AMBULATORY_CARE_PROVIDER_SITE_OTHER): Payer: Medicare Other

## 2021-11-01 DIAGNOSIS — L400 Psoriasis vulgaris: Secondary | ICD-10-CM

## 2021-11-01 MED ORDER — TILDRAKIZUMAB-ASMN 100 MG/ML ~~LOC~~ SOSY
100.0000 mg | PREFILLED_SYRINGE | Freq: Once | SUBCUTANEOUS | Status: AC
  Administered 2021-11-01: 100 mg via SUBCUTANEOUS

## 2021-11-01 NOTE — Progress Notes (Signed)
Ilumy injection given in stomach. Tolerated well. [facility] [phone] ex-10-18-22 ?

## 2022-01-05 ENCOUNTER — Other Ambulatory Visit: Payer: Self-pay | Admitting: Dermatology

## 2022-01-16 ENCOUNTER — Telehealth: Payer: Self-pay | Admitting: Dermatology

## 2022-01-16 NOTE — Telephone Encounter (Signed)
Phone call to [facility] to schedule shipment of the patient's Ilumya. Per [facility] representative the patient's [name] will be delivered on 01/18/2022.

## 2022-01-16 NOTE — Telephone Encounter (Signed)
Katie Tanner with [facility] left message on office voice mail that she was calling to set up shipment for patient's Ilumya.

## 2022-01-17 ENCOUNTER — Telehealth: Payer: Self-pay | Admitting: *Deleted

## 2022-01-17 NOTE — Telephone Encounter (Signed)
Fax from [facility] letting us know htye will ship illumya to our office on 12/19/21. The patients copay is 300.00 paid.

## 2022-01-31 ENCOUNTER — Ambulatory Visit (INDEPENDENT_AMBULATORY_CARE_PROVIDER_SITE_OTHER): Payer: Medicare Other | Admitting: *Deleted

## 2022-01-31 DIAGNOSIS — L400 Psoriasis vulgaris: Secondary | ICD-10-CM

## 2022-01-31 MED ORDER — TILDRAKIZUMAB-ASMN 100 MG/ML ~~LOC~~ SOSY
100.0000 mg | PREFILLED_SYRINGE | Freq: Once | SUBCUTANEOUS | Status: AC
  Administered 2022-01-31: 100 mg via SUBCUTANEOUS

## 2022-01-31 NOTE — Progress Notes (Signed)
Here for ilumya injection. Gave patient 100 mg injection. Patient tolerated well. No ne concerns. Follow up in 3 months.

## 2022-02-20 ENCOUNTER — Telehealth: Payer: Self-pay

## 2022-02-20 NOTE — Telephone Encounter (Signed)
Patient aware office closing and [facility] will continue Ilumya care for injections. Phone [phone]

## 2022-06-14 ENCOUNTER — Ambulatory Visit: Payer: Medicare Other | Admitting: Dermatology

## 2023-10-03 ENCOUNTER — Emergency Department (HOSPITAL_COMMUNITY)

## 2023-10-03 ENCOUNTER — Encounter (HOSPITAL_COMMUNITY): Payer: Self-pay

## 2023-10-03 ENCOUNTER — Other Ambulatory Visit: Payer: Self-pay

## 2023-10-03 ENCOUNTER — Emergency Department (HOSPITAL_COMMUNITY)
Admission: EM | Admit: 2023-10-03 | Discharge: 2023-10-03 | Disposition: A | Discharge status: Home / Self Care | Attending: Emergency Medicine | Admitting: Emergency Medicine

## 2023-10-03 DIAGNOSIS — R109 Unspecified abdominal pain: Secondary | ICD-10-CM | POA: Diagnosis present

## 2023-10-03 DIAGNOSIS — M8580 Other specified disorders of bone density and structure, unspecified site: Secondary | ICD-10-CM | POA: Diagnosis not present

## 2023-10-03 DIAGNOSIS — N132 Hydronephrosis with renal and ureteral calculous obstruction: Secondary | ICD-10-CM | POA: Diagnosis not present

## 2023-10-03 DIAGNOSIS — N201 Calculus of ureter: Secondary | ICD-10-CM

## 2023-10-03 LAB — URINALYSIS, ROUTINE W REFLEX MICROSCOPIC
Bilirubin Urine: NEGATIVE
Glucose, UA: NEGATIVE mg/dL
Ketones, ur: 5 mg/dL — AB
Leukocytes,Ua: NEGATIVE
Nitrite: POSITIVE — AB
Protein, ur: NEGATIVE mg/dL
Specific Gravity, Urine: 1.012 (ref 1.005–1.030)
pH: 5 (ref 5.0–8.0)

## 2023-10-03 LAB — COMPREHENSIVE METABOLIC PANEL
ALT: 27 U/L (ref 0–44)
AST: 29 U/L (ref 15–41)
Albumin: 4.3 g/dL (ref 3.5–5.0)
Alkaline Phosphatase: 78 U/L (ref 38–126)
Anion gap: 10 (ref 5–15)
BUN: 24 mg/dL — ABNORMAL HIGH (ref 8–23)
CO2: 26 mmol/L (ref 22–32)
Calcium: 10.4 mg/dL — ABNORMAL HIGH (ref 8.9–10.3)
Chloride: 103 mmol/L (ref 98–111)
Creatinine, Ser: 1.1 mg/dL — ABNORMAL HIGH (ref 0.44–1.00)
GFR, Estimated: 53 mL/min — ABNORMAL LOW (ref >60)
Glucose, Bld: 140 mg/dL — ABNORMAL HIGH (ref 70–99)
Potassium: 3.7 mmol/L (ref 3.5–5.1)
Sodium: 139 mmol/L (ref 135–145)
Total Bilirubin: 0.8 mg/dL (ref 0.0–1.2)
Total Protein: 7.9 g/dL (ref 6.5–8.1)

## 2023-10-03 LAB — CBC WITH DIFFERENTIAL/PLATELET
Abs Immature Granulocytes: 0.04 10*3/uL (ref 0.00–0.07)
Basophils Absolute: 0 10*3/uL (ref 0.0–0.1)
Basophils Relative: 0 %
Eosinophils Absolute: 0 10*3/uL (ref 0.0–0.5)
Eosinophils Relative: 0 %
HCT: 47.1 % — ABNORMAL HIGH (ref 36.0–46.0)
Hemoglobin: 15.7 g/dL — ABNORMAL HIGH (ref 12.0–15.0)
Immature Granulocytes: 0 %
Lymphocytes Relative: 8 %
Lymphs Abs: 0.8 10*3/uL (ref 0.7–4.0)
MCH: 28.2 pg (ref 26.0–34.0)
MCHC: 33.3 g/dL (ref 30.0–36.0)
MCV: 84.7 fL (ref 80.0–100.0)
Monocytes Absolute: 0.5 10*3/uL (ref 0.1–1.0)
Monocytes Relative: 5 %
Neutro Abs: 9.3 10*3/uL — ABNORMAL HIGH (ref 1.7–7.7)
Neutrophils Relative %: 87 %
Platelets: 147 10*3/uL — ABNORMAL LOW (ref 150–400)
RBC: 5.56 MIL/uL — ABNORMAL HIGH (ref 3.87–5.11)
RDW: 13.2 % (ref 11.5–15.5)
WBC: 10.7 10*3/uL — ABNORMAL HIGH (ref 4.0–10.5)
nRBC: 0 % (ref 0.0–0.2)

## 2023-10-03 LAB — LIPASE, BLOOD: Lipase: 62 U/L — ABNORMAL HIGH (ref 11–51)

## 2023-10-03 MED ORDER — ONDANSETRON 4 MG PO TBDP: 4.0000 mg | ORAL_TABLET | Freq: Three times a day (TID) | ORAL | 0 refills | Status: DC | PRN

## 2023-10-03 MED ORDER — SODIUM CHLORIDE 0.9 % IV BOLUS
1000.0000 mL | Freq: Once | INTRAVENOUS | Status: AC
  Administered 2023-10-03: 1000 mL via INTRAVENOUS

## 2023-10-03 MED ORDER — ONDANSETRON HCL 4 MG/2ML IJ SOLN
4.0000 mg | Freq: Once | INTRAMUSCULAR | Status: AC
  Administered 2023-10-03: 4 mg via INTRAVENOUS
  Filled 2023-10-03: qty 2

## 2023-10-03 MED ORDER — KETOROLAC TROMETHAMINE 30 MG/ML IJ SOLN
30.0000 mg | Freq: Once | INTRAMUSCULAR | Status: AC
  Administered 2023-10-03: 30 mg via INTRAVENOUS
  Filled 2023-10-03: qty 1

## 2023-10-03 MED ORDER — OXYCODONE-ACETAMINOPHEN 5-325 MG PO TABS: 1.0000 | ORAL_TABLET | Freq: Four times a day (QID) | ORAL | 0 refills | Status: DC | PRN

## 2023-10-03 MED ORDER — TAMSULOSIN HCL 0.4 MG PO CAPS: 0.4000 mg | ORAL_CAPSULE | Freq: Every day | ORAL | 0 refills | Status: DC

## 2023-10-03 MED ORDER — IBUPROFEN 600 MG PO TABS: 600.0000 mg | ORAL_TABLET | Freq: Four times a day (QID) | ORAL | 0 refills | Status: DC | PRN

## 2023-10-03 MED ORDER — MORPHINE SULFATE (PF) 4 MG/ML IV SOLN
4.0000 mg | Freq: Once | INTRAVENOUS | Status: AC
  Administered 2023-10-03: 4 mg via INTRAVENOUS
  Filled 2023-10-03: qty 1

## 2023-10-03 NOTE — ED Triage Notes (Addendum)
 Pt c/o left lower back painx2wks. Pt states she went to her PCP 10 days ago and thought she had a UTI and frequent urination and completed atx course and the pain is still there. Pt states went to store and bought AZO and started taking that. Pt states the pain got worse today. Pt denies injury or lifting anything heavy. Pt states her dr told her there wasn't any bacteria in her urine.Pt denies numbness or tingling. Pt denies loss of bowel or bladder.

## 2023-10-03 NOTE — ED Provider Notes (Signed)
 [place] EMERGENCY DEPARTMENT AT [facility] Provider Note   CSN: [id number] Arrival date & time: 10/03/23  1448     History  Chief Complaint  Patient presents with   Back Pain    Katie Tanner is a 73 y.o. female.  Pt is a 73 yo female with pmhx significant for arthritis and psoriasis.  Pt said she's had urinary frequency for over a week.  She did see her pcp who rx'd abx which she took.  Pt said she was told the cx did not grow any bacteria, but she still has sx.  She has some left sided flank pain as well.  No hx kidney stones.        Home Medications Prior to Admission medications   Medication Sig Start Date End Date Taking? Authorizing Provider  ibuprofen (ADVIL) 600 MG tablet Take 1 tablet (600 mg total) by mouth every 6 (six) hours as needed. 10/03/23  Yes Jacalyn Lefevre, MD  ondansetron (ZOFRAN-ODT) 4 MG disintegrating tablet Take 1 tablet (4 mg total) by mouth every 8 (eight) hours as needed. 10/03/23  Yes Jacalyn Lefevre, MD  oxyCODONE-acetaminophen (PERCOCET/ROXICET) 5-325 MG tablet Take 1 tablet by mouth every 6 (six) hours as needed for severe pain (pain score 7-10). 10/03/23  Yes Jacalyn Lefevre, MD  tamsulosin (FLOMAX) 0.4 MG CAPS capsule Take 1 capsule (0.4 mg total) by mouth daily. 10/03/23  Yes Jacalyn Lefevre, MD  Bacillus Coagulans-Inulin (PROBIOTIC) 1-250 BILLION-MG CAPS Probiotic    [provider]  Cholecalciferol (VITAMIN D3) 1.25 MG (50000 UT) CAPS Vitamin D3    [provider]  clobetasol (OLUX) 0.05 % topical foam Apply to skin 1-2 times daily not for face or skin folds 06/14/21   Janalyn Harder, MD  diclofenac Sodium (VOLTAREN) 1 % GEL diclofenac 1 % topical gel  APPLY 1/2 1 GRAM 4 TIMES A DAY AS NEEDED TRANSDERMALLY    [provider]  halobetasol (ULTRAVATE) 0.05 % ointment APPLY TO AFFECTED AREA AT BEDTIME AS DIRECTED 09/23/19   [provider]  hydrochlorothiazide (HYDRODIURIL) 25 MG tablet Take 12.5 mg by  mouth every morning. 11/05/19   [provider]  ILUMYA 100 MG/ML subcutaneous injection INJECT 100MG  SUBCUTANEOUSLY EVERY 12 WEEKS AS DIRECTED. 01/11/22   Sheffield, Harvin Hazel R, PA-C  meloxicam (MOBIC) 15 MG tablet meloxicam 15 mg tablet  TAKE 1 TABLET BY MOUTH EVERY DAY    [provider]      Allergies    Patient has no known allergies.    Review of Systems   Review of Systems  Genitourinary:  Positive for dysuria and flank pain.  All other systems reviewed and are negative.   Physical Exam Updated Vital Signs BP 137/67   Pulse 71   Temp (!) 97.5 F (36.4 C)   Resp 15   Ht 5\' 4"  (1.626 m)   Wt 68.5 kg   SpO2 100%   BMI 25.92 kg/m  Physical Exam Vitals and nursing note reviewed.  Constitutional:      Appearance: Normal appearance.  HENT:     Head: Normocephalic and atraumatic.     Right Ear: External ear normal.     Left Ear: External ear normal.     Nose: Nose normal.     Mouth/Throat:     Mouth: Mucous membranes are dry.  Eyes:     Extraocular Movements: Extraocular movements intact.     Conjunctiva/sclera: Conjunctivae normal.     Pupils: Pupils are equal, round, and reactive  to light.  Cardiovascular:     Rate and Rhythm: Normal rate and regular rhythm.     Pulses: Normal pulses.     Heart sounds: Normal heart sounds.  Pulmonary:     Effort: Pulmonary effort is normal.     Breath sounds: Normal breath sounds.  Abdominal:     General: Abdomen is flat. Bowel sounds are normal.     Palpations: Abdomen is soft.  Musculoskeletal:        General: Normal range of motion.     Cervical back: Normal range of motion and neck supple.  Skin:    General: Skin is warm.     Capillary Refill: Capillary refill takes less than 2 seconds.     Findings: No rash.  Neurological:     General: No focal deficit present.     Mental Status: She is alert and oriented to person, place, and time.  Psychiatric:        Mood and Affect: Mood normal.        Behavior:  Behavior normal.     ED Results / Procedures / Treatments   Labs (all labs ordered are listed, but only abnormal results are displayed) Labs Reviewed  CBC WITH DIFFERENTIAL/PLATELET - Abnormal; Notable for the following components:      Result Value   WBC 10.7 (*)    RBC 5.56 (*)    Hemoglobin 15.7 (*)    HCT 47.1 (*)    Platelets 147 (*)    Neutro Abs 9.3 (*)    All other components within normal limits  COMPREHENSIVE METABOLIC PANEL - Abnormal; Notable for the following components:   Glucose, Bld 140 (*)    BUN 24 (*)    Creatinine, Ser 1.10 (*)    Calcium 10.4 (*)    GFR, Estimated 53 (*)    All other components within normal limits  LIPASE, BLOOD - Abnormal; Notable for the following components:   Lipase 62 (*)    All other components within normal limits  URINALYSIS, ROUTINE W REFLEX MICROSCOPIC - Abnormal; Notable for the following components:   Color, Urine AMBER (*)    Hgb urine dipstick MODERATE (*)    Ketones, ur 5 (*)    Nitrite POSITIVE (*)    Bacteria, UA RARE (*)    All other components within normal limits    EKG None  Radiology CT Renal Stone Study Result Date: 10/03/2023 CLINICAL DATA:  Abdominal/flank pain, stone suspected EXAM: CT ABDOMEN AND PELVIS WITHOUT CONTRAST TECHNIQUE: Multidetector CT imaging of the abdomen and pelvis was performed following the standard protocol without IV contrast. RADIATION DOSE REDUCTION: This exam was performed according to the departmental dose-optimization program which includes automated exposure control, adjustment of the mA and/or kV according to patient size and/or use of iterative reconstruction technique. COMPARISON:  CT abdomen 03/05/2015 FINDINGS: Lower chest: No acute abnormality. Hepatobiliary: No focal liver abnormality. Status post cholecystectomy. No biliary dilatation. Pancreas: No focal lesion. Normal pancreatic contour. No surrounding inflammatory changes. No main pancreatic ductal dilatation. Spleen: Normal  in size without focal abnormality. Adrenals/Urinary Tract: No adrenal nodule bilaterally. Elongated 6 x 2 mm left ureterovesicular junction stone. Associated proximal mild hydronephrosis. No left nephrolithiasis. No right nephroureterolithiasis. No right hydroureteronephrosis. The urinary bladder is unremarkable. Stomach/Bowel: Stomach is within normal limits. No evidence of bowel wall thickening or dilatation. The appendix is not definitely identified with no inflammatory changes in the right lower quadrant to suggest acute appendicitis. Vascular/Lymphatic: No abdominal aorta or  iliac aneurysm. Mild to moderate atherosclerotic plaque of the aorta and its branches. No abdominal, pelvic, or inguinal lymphadenopathy. Reproductive: Uterus and bilateral adnexa are unremarkable. Other: No abdominal wall hernia or abnormality. No suspicious lytic or blastic osseous lesions. No acute displaced fracture. Diffusely decreased bone density. Multilevel degenerative changes of the spine. Musculoskeletal: No abdominal wall hernia or abnormality. No suspicious lytic or blastic osseous lesions. No acute displaced fracture. L2 vertebral body hemangioma. IMPRESSION: 1. Obstructive elongated 6x2 mm left ureterovesicular junction stone. 2. Diffusely decreased bone density. Consider correlation with DEXA scan if not previously obtained. Electronically Signed   By: Tish Frederickson M.D.   On: 10/03/2023 19:16   DG Lumbar Spine Complete Result Date: 10/03/2023 CLINICAL DATA:  Back pain. EXAM: LUMBAR SPINE - COMPLETE 4+ VIEW COMPARISON:  CT abdomen pelvis dated 03/05/2015. FINDINGS: Five lumbar type vertebra. There is no acute fracture or subluxation of the lumbar spine. The bones are osteopenic. Multilevel facet arthropathy. The visualized posterior elements are intact. The soft tissues are unremarkable. Right upper quadrant cholecystectomy clips. IMPRESSION: 1. No acute findings. 2. Multilevel facet arthropathy. Electronically Signed    By: Elgie Collard M.D.   On: 10/03/2023 16:44    Procedures Procedures    Medications Ordered in ED Medications  sodium chloride 0.9 % bolus 1,000 mL (0 mLs Intravenous Stopped 10/03/23 1822)  morphine (PF) 4 MG/ML injection 4 mg (4 mg Intravenous Given 10/03/23 1658)  ondansetron (ZOFRAN) injection 4 mg (4 mg Intravenous Given 10/03/23 1658)  ketorolac (TORADOL) 30 MG/ML injection 30 mg (30 mg Intravenous Given 10/03/23 1658)    ED Course/ Medical Decision Making/ A&P                                 Medical Decision Making Amount and/or Complexity of Data Reviewed Labs: ordered. Radiology: ordered.  Risk Prescription drug management.   This patient presents to the ED for concern of dysuria and flank pain, this involves an extensive number of treatment options, and is a complaint that carries with it a high risk of complications and morbidity.  The differential diagnosis includes uti, pyelo, kidney stone, shingles   Co morbidities that complicate the patient evaluation  arthritis and psoriasis   Additional history obtained:  Additional history obtained from epic chart review External records from outside source obtained and reviewed including husband   Lab Tests:  I Ordered, and personally interpreted labs.  The pertinent results include:  cbc with wbc 10.7, plt sl low at 147; cmp with bun sl elevated at 24 and cr sl elevated at 1.1 (gfr 53), lip 62   Imaging Studies ordered:  I ordered imaging studies including ct renal and lumbar spine I independently visualized and interpreted imaging which showed  Lumbar: No acute findings.  2. Multilevel facet arthropathy.  CT renal: . Obstructive elongated 6x2 mm left ureterovesicular junction  stone.  2. Diffusely decreased bone density. Consider correlation with DEXA  scan if not previously obtained.   I agree with the radiologist interpretation   Medicines ordered and prescription drug management:  I ordered  medication including toradol, morphine, zofran, ivfs  for sx  Reevaluation of the patient after these medicines showed that the patient improved I have reviewed the patients home medicines and have made adjustments as needed   Test Considered:  ct   Critical Interventions:  Ivfs/pain control   Problem List / ED Course:  Right ureteral kidney  stone:  pain is under control.  No uti.  No evidence of sepsis.  She is given a strainer.  Pt is stable for d/c home.  She knows to return if worse.  F/u with urology. Decreased bone density:  pt said she had a DEXA last summer, but her doctor did not tell her she needed any medication.  She is told of ct findings and is to f/u with pcp.   Reevaluation:  After the interventions noted above, I reevaluated the patient and found that they have :improved   Social Determinants of Health:  Lives at home   Dispostion:  After consideration of the diagnostic results and the patients response to treatment, I feel that the patent would benefit from discharge with outpatient f/u.          Final Clinical Impression(s) / ED Diagnoses Final diagnoses:  Obstruction of left ureteropelvic junction (UPJ) due to stone  Decreased bone density    Rx / DC Orders ED Discharge Orders          Ordered    oxyCODONE-acetaminophen (PERCOCET/ROXICET) 5-325 MG tablet  Every 6 hours PRN        10/03/23 1934    ibuprofen (ADVIL) 600 MG tablet  Every 6 hours PRN        10/03/23 1934    tamsulosin (FLOMAX) 0.4 MG CAPS capsule  Daily        10/03/23 1934    ondansetron (ZOFRAN-ODT) 4 MG disintegrating tablet  Every 8 hours PRN        10/03/23 1934              Jacalyn Lefevre, MD 10/03/23 [phone]

## 2023-10-04 ENCOUNTER — Other Ambulatory Visit: Payer: Self-pay | Admitting: Urology

## 2023-10-05 ENCOUNTER — Encounter (HOSPITAL_COMMUNITY): Payer: Self-pay | Admitting: Urology

## 2023-10-05 NOTE — Progress Notes (Signed)
  Spoke with [facility] in pharmacy and requested they put Levaquin 500 mg in pyxis for patient scheduled for ESWL on Monday.

## 2023-10-05 NOTE — H&P (Signed)
 Office Visit Report     10/04/2023   --------------------------------------------------------------------------------   Katie Tanner  MRN: [id number]  DOB: 1951-05-06, 73 year old Female  SSN:    PRIMARY CARE:     REFERRING:    PROVIDER:  Anastasia Fiedler, P.A.  LOCATION:  [facility] [phone]     --------------------------------------------------------------------------------   CC/HPI: 73 year old female presents today as a hospital follow-up for left ureteral stone. CT demonstrated a 6 x 2 mm UVJ stone with proximal hydronephrosis. She has had an increase in urinary frequency for the past week. She has had intermittent nausea without vomiting and left flank pain but has only taken Tylenol so far. She was started on tamsulosin nightly, and provided Zofran and Percocet as needed. This is her first stone event. UA today with microscopic hematuria and rare bacteria. Denies gross hematuria, dysuria, fever/chills.   BUN 24, creatinine 1.1     ALLERGIES: None   MEDICATIONS: No Medications    GU PSH: None   NON-GU PSH: Appendectomy - 1996 Bilateral Tubal Ligation - 1977 Remove Gallbladder - 1999     GU PMH: None   NON-GU PMH: Arthritis Hypertension    FAMILY HISTORY: No Family History    SOCIAL HISTORY: Marital Status: Single Ethnicity: Not Hispanic Or Latino; Race: White Current Smoking Status: Patient has never smoked.   Tobacco Use Assessment Completed: Used Tobacco in last 30 days? Has never drank.  Does not use drugs. Drinks 3 caffeinated drinks per day.    REVIEW OF SYSTEMS:    GU Review Female:   Patient reports frequent urination, hard to postpone urination, leakage of urine, and stream starts and stops. Patient denies burning /pain with urination, get up at night to urinate, trouble starting your stream, have to strain to urinate, and being pregnant.  Gastrointestinal (Upper):   Patient reports nausea. Patient denies vomiting and  indigestion/ heartburn.  Gastrointestinal (Lower):   Patient denies diarrhea and constipation.  Constitutional:   Patient reports fatigue. Patient denies fever, night sweats, and weight loss.  Skin:   Patient reports skin rash/ lesion and itching.   Eyes:   Patient reports blurred vision. Patient denies double vision.  Ears/ Nose/ Throat:   Patient denies sore throat and sinus problems.  Hematologic/Lymphatic:   Patient denies swollen glands and easy bruising.  Cardiovascular:   Patient denies chest pains and leg swelling.  Respiratory:   Patient denies cough and shortness of breath.  Endocrine:   Patient reports excessive thirst.   Musculoskeletal:   Patient reports back pain. Patient denies joint pain.  Neurological:   Patient denies headaches and dizziness.  Psychologic:   Patient denies depression and anxiety.   VITAL SIGNS:      10/04/2023 09:55 AM  BP 131/73 mmHg  Pulse 91 /min  Temperature 97.8 F / 36.5 C   MULTI-SYSTEM PHYSICAL EXAMINATION:    Constitutional: Well-nourished. No physical deformities. Normally developed. Good grooming.  Neck: Neck symmetrical, not swollen. Normal tracheal position.  Respiratory: No labored breathing, no use of accessory muscles.   Cardiovascular: Normal temperature, normal extremity pulses, no swelling, no varicosities.  Neurologic / Psychiatric: Oriented to time, oriented to place, oriented to person. No depression, no anxiety, no agitation.  Gastrointestinal: No mass, no tenderness, no rigidity, non obese abdomen. Left CVA tenderness.  Musculoskeletal: Normal gait and station of head and neck.     Complexity of Data:  Source Of History:  Patient, Medical Record Summary  Lab Test Review:  BMP  Records Review:   Previous Doctor Records, Previous Hospital Records, Previous Patient Records  Urine Test Review:   Urinalysis  X-Ray Review: KUB: Reviewed Films. Reviewed Report. Discussed With Patient.  C.T. Abdomen/Pelvis: Reviewed Films.  Reviewed Report. Discussed With Patient. CLINICAL DATA: Abdominal/flank pain, stone suspected   EXAM:  CT ABDOMEN AND PELVIS WITHOUT CONTRAST   TECHNIQUE:  Multidetector CT imaging of the abdomen and pelvis was performed  following the standard protocol without IV contrast.   RADIATION DOSE REDUCTION: This exam was performed according to the  departmental dose-optimization program which includes automated  exposure control, adjustment of the mA and/or kV according to  patient size and/or use of iterative reconstruction technique.   COMPARISON: CT abdomen 03/05/2015   FINDINGS:  Lower chest: No acute abnormality.   Hepatobiliary: No focal liver abnormality. Status post  cholecystectomy. No biliary dilatation.   Pancreas: No focal lesion. Normal pancreatic contour. No surrounding  inflammatory changes. No main pancreatic ductal dilatation.   Spleen: Normal in size without focal abnormality.   Adrenals/Urinary Tract:   No adrenal nodule bilaterally.   Elongated 6 x 2 mm left ureterovesicular junction stone. Associated  proximal mild hydronephrosis. No left nephrolithiasis. No right  nephroureterolithiasis. No right hydroureteronephrosis.   The urinary bladder is unremarkable.   Stomach/Bowel: Stomach is within normal limits. No evidence of bowel  wall thickening or dilatation. The appendix is not definitely  identified with no inflammatory changes in the right lower quadrant  to suggest acute appendicitis.   Vascular/Lymphatic: No abdominal aorta or iliac aneurysm. Mild to  moderate atherosclerotic plaque of the aorta and its branches. No  abdominal, pelvic, or inguinal lymphadenopathy.   Reproductive: Uterus and bilateral adnexa are unremarkable.   Other:   No abdominal wall hernia or abnormality.   No suspicious lytic or blastic osseous lesions. No acute displaced  fracture. Diffusely decreased bone density. Multilevel degenerative  changes of the spine.    Musculoskeletal:   No abdominal wall hernia or abnormality.   No suspicious lytic or blastic osseous lesions. No acute displaced  fracture. L2 vertebral body hemangioma.   IMPRESSION:  1. Obstructive elongated 6x2 mm left ureterovesicular junction  stone.  2. Diffusely decreased bone density. Consider correlation with DEXA  scan if not previously obtained.    Electronically Signed  By: Tish Frederickson M.D.  On: 10/03/2023 19:16    10/04/23  Urinalysis  Urine Appearance Clear   Urine Color Yellow   Urine Glucose Neg mg/dL  Urine Bilirubin Neg mg/dL  Urine Ketones Neg mg/dL  Urine Specific Gravity 1.020   Urine Blood 3+ ery/uL  Urine pH <=5.0   Urine Protein Neg mg/dL  Urine Urobilinogen 0.2 mg/dL  Urine Nitrites Neg   Urine Leukocyte Esterase Neg leu/uL  Urine WBC/hpf 0 - 5/hpf   Urine RBC/hpf 3 - 10/hpf   Urine Epithelial Cells NS (Not Seen)   Urine Bacteria Few (10-25/hpf)   Urine Mucous Present   Urine Yeast NS (Not Seen)   Urine Trichomonas Not Present   Urine Cystals NS (Not Seen)   Urine Casts Hyaline   Urine Sperm Not Present    PROCEDURES:         KUB - 66440  A single view of the abdomen is obtained. Bilateral renal shadows visualized without obvious calcifications noted within. Right ureteral tract grossly clear. left UVJ stone visualized in similar position compared to yesterday's CT.       Marland Kitchen Patient confirmed No Neulasta OnPro Device.  Visit Complexity - G2211          Urinalysis w/Scope Dipstick Dipstick Cont'd Micro  Color: Yellow Bilirubin: Neg mg/dL WBC/hpf: 0 - 5/hpf  Appearance: Clear Ketones: Neg mg/dL RBC/hpf: 3 - 78/GNF  Specific Gravity: 1.020 Blood: 3+ ery/uL Bacteria: Few (10-25/hpf)  pH: <=5.0 Protein: Neg mg/dL Cystals: NS (Not Seen)  Glucose: Neg mg/dL Urobilinogen: 0.2 mg/dL Casts: Hyaline    Nitrites: Neg Trichomonas: Not Present    Leukocyte Esterase: Neg leu/uL Mucous: Present      Epithelial Cells: NS (Not  Seen)      Yeast: NS (Not Seen)      Sperm: Not Present    ASSESSMENT:      ICD-10 Details  1 GU:   Ureteral calculus - N20.1 Undiagnosed New Problem  2   Ureteral obstruction secondary to calculous - N13.2 Undiagnosed New Problem   PLAN:           Orders Labs Urine Culture  X-Rays: KUB          Schedule Return Visit/Planned Activity: Next Available Appointment - Schedule Surgery          Document Letter(s):  Created for Patient: Clinical Summary         Notes:   UVJ stone visible on KUB. We discussed the management of ureteral stones. These options include observation, ureteroscopy, and shockwave lithotripsy. With any of these management options I discussed the signs and symptoms of infection and the need for emergent treatment should these be experienced. For each option we discussed the ability of each procedure to clear the patient of their stone burden.   For observation I described the risks which include but are not limited to silent renal damage, life-threatening infection, need for emergent surgery, failure to pass stone, and pain.   For ureteroscopy I described the risks which include heart attack, stroke, pulmonary embolus, death, bleeding, infection, damage to contiguous structures, positioning injury, ureteral stricture, ureteral avulsion, ureteral injury, need for ureteral stent, inability to perform ureteroscopy, need for an interval procedure, inability to clear stone burden, stent discomfort and pain.   For shockwave lithotripsy I described the risks which include arrhythmia, kidney contusion, kidney hemorrhage, need for transfusion, pain, inability to break up stone, inability to pass stone fragments, Steinstrasse, infection associated with obstructing stones, need for different surgical procedure, need for repeat shockwave lithotripsy.   She would like to get scheduled for lithotripsy on Monday with a goal of passing her stone over the weekend. She will continue on  tamsulosin daily. She has oxycodone and ondansetron to take as needed.        Next Appointment:      Next Appointment: 10/08/2023 10:00 AM    Appointment Type: Surgery     Location: [facility] [phone]    Provider: Heloise Purpura, M.D.    Reason for Visit: OP--WL---LEFT ESWL      * Signed by Anastasia Fiedler, P.A. on 10/04/23 at 3:32 PM (EDT)*

## 2023-10-05 NOTE — Progress Notes (Signed)
 Spoke w/ via phone for pre-op interview--- Lab needs dos---- KUB        Lab results------ COVID test ---Not indicated--patient states asymptomatic no test needed Arrive at -----0800-- NPO after MN  Pre-Surgery Ensure or G2:  Med rec completed Medications to take morning of surgery ---zofran, flomax, Blood pressure med-- Diabetic medication -----  GLP1 agonist last dose: GLP1 instructions:  Patient instructed no nail polish to be worn day of surgery Patient instructed to bring photo id and insurance card day of surgery Patient aware to have Driver (ride ) / caregiver    for 24 hours after surgery - spouse- Molly Maduro [phone] Patient Special Instructions ----- Pre-Op special Instructions -----per Hayden Rasmussen  Patient verbalized understanding of instructions that were given at this phone interview. Patient denies chest pain, sob, fever, cough at the interview.

## 2023-10-08 ENCOUNTER — Encounter (HOSPITAL_COMMUNITY): Admission: RE | Payer: Self-pay | Source: Home / Self Care

## 2023-10-08 ENCOUNTER — Ambulatory Visit (HOSPITAL_COMMUNITY): Admission: RE | Admit: 2023-10-08 | Source: Home / Self Care | Admitting: Urology

## 2023-10-08 SURGERY — LITHOTRIPSY, ESWL
Anesthesia: LOCAL | Laterality: Left

## 2023-10-08 NOTE — Progress Notes (Signed)
 RN called patient to check arrival time and patient stated that she passed her stone on 3/29. She stated that she had contacted Urologist office and hospital. Message was not relayed to nursing/ Litho staff beforehand. RN contacted physician and Rojelio Brenner on-site staff to inform.

## 2024-04-21 ENCOUNTER — Other Ambulatory Visit: Payer: Self-pay | Admitting: Gastroenterology

## 2024-04-21 DIAGNOSIS — K6389 Other specified diseases of intestine: Secondary | ICD-10-CM

## 2024-04-24 ENCOUNTER — Encounter: Payer: Self-pay | Admitting: Radiology

## 2024-04-24 ENCOUNTER — Ambulatory Visit
Admission: RE | Admit: 2024-04-24 | Discharge: 2024-04-24 | Disposition: A | Discharge status: Home / Self Care | Source: Ambulatory Visit | Attending: Gastroenterology | Admitting: Gastroenterology

## 2024-04-24 DIAGNOSIS — K6389 Other specified diseases of intestine: Secondary | ICD-10-CM

## 2024-04-24 MED ORDER — IOPAMIDOL (ISOVUE-300) INJECTION 61%
100.0000 mL | Freq: Once | INTRAVENOUS | Status: AC | PRN
  Administered 2024-04-24: 100 mL via INTRAVENOUS

## 2024-05-21 ENCOUNTER — Ambulatory Visit: Payer: Self-pay | Admitting: Surgery

## 2024-05-21 DIAGNOSIS — Z860100 Personal history of colon polyps, unspecified: Secondary | ICD-10-CM | POA: Insufficient documentation

## 2024-05-21 DIAGNOSIS — I10 Essential (primary) hypertension: Secondary | ICD-10-CM | POA: Insufficient documentation

## 2024-05-21 DIAGNOSIS — M1712 Unilateral primary osteoarthritis, left knee: Secondary | ICD-10-CM | POA: Insufficient documentation

## 2024-05-21 DIAGNOSIS — M8580 Other specified disorders of bone density and structure, unspecified site: Secondary | ICD-10-CM | POA: Insufficient documentation

## 2024-05-21 DIAGNOSIS — R739 Hyperglycemia, unspecified: Secondary | ICD-10-CM

## 2024-05-21 DIAGNOSIS — L4050 Arthropathic psoriasis, unspecified: Secondary | ICD-10-CM | POA: Insufficient documentation

## 2024-05-21 DIAGNOSIS — K6389 Other specified diseases of intestine: Secondary | ICD-10-CM | POA: Insufficient documentation

## 2024-05-21 NOTE — H&P (View-Only) (Signed)
 PATIENT NAME: Katie Tanner MRN: [id number] DOB: [birth year] PHYSICIANS:  REFERRING PHYSICIAN:  Dianna Jerrell BROCKS, MD  CARE TEAM:   Patient Care Team: Marvene Prentice Ingle, NP as PCP - General (Family Medicine) Sheldon, Elspeth Bitter, MD as Consulting Provider (General Surgery) Dianna, Jerrell BROCKS, MD (Gastroenterology)  CONSULTING PROVIDER:  ELSPETH BITTER SHELDON, MD     SUBJECTIVE   Chief Complaint: New Consultation    Katie Tanner is a 73 y.o. female  who is seen today as an office consultation  at the request of DrRONITA Dianna  for evaluation of colon mass    History of Present Illness: Level of Interpreter Services: No interpreter needed (no language barrier)  73 year old active woman.  History of polyps and family history of that..  Followed by [facility]ology.  Underwent 3-year follow-up colonoscopy.  1 small sigmoid polyp removed.  Mass found in the cecum.  Not circumferential.  Polypoid and nonobstructing.  Pathology not consistent with adenocarcinoma but seen on CAT scan as well.  Given size location concerns, not felt to be endoscopically removable and therefore surgical consultation requested.  Patient comes in with her husband.  She moves her bowels usually every day.  Not on a fiber supplement.  She had her appendix removed in 1990s.  Also had cholecystectomy done 09/01/2005.  History of psoriasis and psoriatic arthritis on Tremfya.  Gets that every 2 months.  On meloxicam.  Moderately active and can walk half hour without difficulty.  No diabetes.  Does not smoke.  Denies any nausea vomiting or crampy abdominal pain.  No rectal bleeding or melena.  Not on any blood thinners.  No cardiac or pulmonary issues that she is aware of.     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)   History of adenomatous polyp  of colon   Psoriatic arthritis (CMS/HHS-HCC)    Past Surgical History:  Procedure Laterality Date   APPENDECTOMY     CHOLECYSTECTOMY       No Known Allergies  Current Outpatient Medications on File Prior to Visit  Medication Sig Dispense Refill   Bacillus coagulans-inulin 1 billion-250 cell-mg Cap Probiotic     cholecalciferol (VITAMIN D3) 1,250 mcg (50,000 unit) capsule Vitamin D3     diclofenac (VOLTAREN) 1 % topical gel diclofenac 1 % topical gel  APPLY 1/2 1 GRAM 4 TIMES A DAY AS NEEDED TRANSDERMALLY     GENTLE LAXATIVE, BISACODYL, 5 mg EC tablet as directed     lisinopriL-hydroCHLOROthiazide (ZESTORETIC) 10-12.5 mg tablet Take 1 tablet by mouth once daily     meloxicam (MOBIC) 15 MG tablet meloxicam 15 mg tablet  TAKE 1 TABLET BY MOUTH EVERY DAY     tildrakizumab -asmn (ILUMYA ) 100 mg/mL Syrg INJECT 100MG  SUBCUTANEOUSLY EVERY 12 WEEKS AS DIRECTED.     No current facility-administered medications on file prior to visit.    Family History  Problem Relation Age of Onset   Obesity Mother    High blood pressure (Hypertension) Mother    Hyperlipidemia (Elevated cholesterol) Mother    Deep vein thrombosis (DVT or abnormal blood clot formation) Mother    High blood pressure (Hypertension) Father    Hyperlipidemia (Elevated cholesterol) Father    Heart valve disease Father    High blood pressure (Hypertension) Brother    Hyperlipidemia (Elevated cholesterol) Brother  Social History   Tobacco Use  Smoking Status Never  Smokeless Tobacco Never     Social History   Socioeconomic History   Marital status: Married  Tobacco Use   Smoking status: Never   Smokeless tobacco: Never  Substance and Sexual Activity   Alcohol use: Never   Drug use: Never   Social Drivers of Health   Stress: No Stress Concern Present (08/16/2021)   Received from [facility] - Occupational Stress Questionnaire    Feeling  of Stress : Only a little  Housing Stability: Unknown (05/21/2024)   Housing Stability Vital Sign    Homeless in the Last Year: No    ############################################################  Review of Systems: A complete review of systems (ROS) was obtained from the patient.   We have reviewed this information and discussed as appropriate with the patient.   See HPI as well for other pertinent ROS.  Constitutional:  No fevers, chills, sweats.  Weight stable Eyes:  No vision changes, No discharge HENT:  No sore throats, nasal drainage Lymph: No neck swelling, No bruising easily Pulmonary:  No cough, productive sputum CV: No orthopnea, PND . No exertional chest/neck/shoulder/arm pain.  Patient can walk 30 minutes without difficulty.    GI:  No personal nor family history of GI/colon cancer, inflammatory bowel disease, irritable bowel syndrome, allergy such as Celiac Sprue, dietary/dairy problems, colitis, ulcers nor gastritis.  No recent sick contacts/gastroenteritis.  No travel outside the country.  No changes in diet.  Renal: No UTIs, No hematuria Genital:  No drainage, bleeding, masses Musculoskeletal: No severe joint pain.   Good ROM major joints Skin:  No sores or lesions Heme/Lymph:  No easy bleeding.  No swollen lymph nodes Neuro:  No active seizures.  No facial droop Psych:  No hallucinations.  No agitation  OBJECTIVE   Vitals:   05/21/24 1037 05/21/24 1038  BP: (!) 158/98   Pulse: (!) 120   Temp: 36.7 C (98 F)   SpO2: 96%   Weight: 68.9 kg (151 lb 12.8 oz)   Height: 162.6 cm (5' 4)   PainSc:  0-No pain    Body mass index is 26.06 kg/m.  PHYSICAL EXAM:   Constitutional: Not cachectic.  Hygeine adequate.  Vitals signs as above.   Eyes: Wears glasses - vision corrected,Pupils reactive, normal extraocular movements. Sclera nonicteric Neuro: CN II-XII intact.  No major focal sensory defects.  No major motor deficits. Lymph: No head/neck/groin  lymphadenopathy Psych:  No severe agitation.  No severe anxiety.  Judgment & insight Adequate, Oriented x4, HENT: Normocephalic, Mucus membranes moist.  No thrush.  Hearing: adequate Neck: Supple, No tracheal deviation.  No obvious thyromegaly Chest: No pain to chest wall compression.  Good respiratory excursion.  No audible wheezing CV:  Pulses intact.  regular.  No major extremity edema Ext: No obvious deformity or contracture.  Edema: Not present.  No cyanosis Skin: No major subcutaneous nodules.  Warm and dry Musculoskeletal: Severe joint rigidity not present.  No obvious clubbing.  No digital petechiae.  Mobility: no assist device moving easily without restrictions  Abdomen:  Flat Soft.   Nondistended.  Nontender.    Hernia: Not present. Diastasis recti: Not present.  No hepatomegaly.  No splenomegaly.  Genital/Pelvic:  Inguinal hernia: Not present.  Inguinal lymph nodes: without lymphadenopathy nor hidradenitis.    Rectal: (Deferred)  PE Chaperone note: No sensitive exam performed    ###################################################################  Labs, Imaging and Diagnostic Testing:  Located in 'Care Everywhere' section of Epic EMR chart   PRIOR [facility] CLINIC NOTES:  Not applicable   SURGERY NOTES:  Not applicable   PATHOLOGY:  Located in 'Care Everywhere' section of Epic EMR chart    Assessment and Plan:  DIAGNOSES:  Diagnoses and all orders for this visit:  Mass of cecum -     ondansetron  (ZOFRAN ) 4 MG tablet; Take 1 tablet (4 mg total) by mouth every 8 (eight) hours as needed for Nausea  History of adenomatous polyp of colon  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 -     metroNIDAZOLE  (FLAGYL) 500 MG tablet; Take 2 tablets (1,000 mg total) by mouth 3 (three) times daily for 3 doses SEE BOWEL PREP INSTRUCTIONS: Take 2 tablets at 2pm, 3pm, and 10pm the day prior to your colon operation. -     polyethylene glycol (MIRALAX) powder; Take 233.75 g by mouth once for 1 dose Take according to your procedure prep instructions. -     bisacodyL (DULCOLAX) 5 mg EC tablet; Take 4 tablets (20 mg total) by mouth once daily as needed for Constipation for up to 1 dose     ASSESSMENT/PLAN  Active woman with history of colon polyps on every 3 year endoscopies.  Found found to have mass in cecum near ileocecal valve.  Not classic for adenocarcinoma but not normal either.  Not lipomatous.  Seen on CAT scan.  No evidence of any metastatic disease.  Perhaps this could be a carcinoid or something else atypical.  Given the moderately sized mass at ileocecal valve, it needs to go.  Not a good candidate for a endoscopic removal given the fact that it looks somewhat submucosal to me as around the ileocecal valve.  I recommended segmental ileocolonic resection.  Robotic proximal colectomy.  Intracorporeal anastomosis.  She is very active with good performance status and is healthier than stated age.  I would be aggressive with colectomy.  She is chronically immunosuppressed on Tremfya for psoriatic arthritis but not steroids.  No longer on methotrexate.  Risk of leak theoretically might be slightly higher but I do not think it is major and I would try to do immediate anastomosis.  Certainly risks in a more emergent setting are higher.  The anatomy & physiology of the digestive tract was discussed.  The pathophysiology of the colon was discussed.  Natural history risks without surgery was discussed.   I feel the risks of no intervention will lead to serious problems that outweigh the operative risks; therefore, I recommended a partial colectomy to remove the pathology.  Minimally invasive (Robotic/Laparoscopic) &  open techniques were discussed.   Risks such as bleeding, infection, abscess, leak, reoperation, injury to other organs, need for repair of tissues / organs, possible ostomy, hernia, heart attack, stroke, death, and other risks were discussed.  I noted a good likelihood this will help address the problem.   Goals of post-operative recovery were discussed as well.   Need for adequate nutrition, daily bowel regimen and healthy physical activity, to optimize recovery was noted as well. We will work to minimize complications.  Educational materials were available as well.  Questions were answered.  The patient expresses understanding &  wishes to proceed with surgery.  Husband in the room with her.  Questions answered.  He agrees to proceed as well.      FOLLOWUP:  Return for WE RECOMMEND SURGERY - See instructions.  I have reviewed this patient's available data, including medical history, doctor notes, radiology & other studies, events of note, test results, physical exam, etc as part of my evaluation.   A significant portion of that time was spent in counseling in discussion of management, need for further testing, need for other medical or surgical input, etc.   Care was provided by me.  Based on my assessment, this care required MODERATE- Level 4 level of medical decision making.  05/21/2024   The plan was discussed in detail with the patient today, who expressed understanding & appreciation.  The patient has my contact information, and understands to call me with any additional questions or concerns.  I & my group would be happy to see the patient back sooner if the need arises.  Of note, portions of this report may have been transcribed using voice recognition software. Effort was made to ensure accuracy; however, inadvertent computerized transcription errors with sound-a-like substitutions may be present.   Any transcriptional errors that result from this process are  unintentional.  ########################################################   Elspeth KYM Schultze, MD, FACS, MASCRS Esophageal, Gastrointestinal & Colorectal Surgery Robotic and Minimally Invasive Surgery  [facility]  [address], [address] [place], [place] [address] [phone] Fax [phone] Main              05/21/2024

## 2024-06-09 ENCOUNTER — Encounter (HOSPITAL_COMMUNITY): Payer: Self-pay

## 2024-06-09 NOTE — Patient Instructions (Addendum)
 SURGICAL WAITING ROOM VISITATION  Patients having surgery or a procedure may have no more than 2 support people in the waiting area - these visitors may rotate.    Children under the age of 78 must have an adult with them who is not the patient.  Visitors with respiratory illnesses are discouraged from visiting and should remain at home.  If the patient needs to stay at the hospital during part of their recovery, the visitor guidelines for inpatient rooms apply. Pre-op nurse will coordinate an appropriate time for 1 support person to accompany patient in pre-op.  This support person may not rotate.    Please refer to the [facility] website for the visitor guidelines for Inpatients (after your surgery is over and you are in a regular room).       Your procedure is scheduled on: 06-20-24   Report to [facility] Main Entrance    Report to admitting at    11:30  AM   Call this number if you have problems the morning of surgery [phone]   FOLLOW A CLEAR LIQUID DIET THE DAY OF YOUR BOWEL PREP :.     You may have the following  clear liquids until __10:45 ____ AM/ DAY OF SURGERY   then nothing by mouth  Water Non-Citrus Juices (without pulp, NO RED-Apple, White grape, White cranberry) Black Coffee (NO MILK/CREAM OR CREAMERS, sugar ok)  Clear Tea (NO MILK/CREAM OR CREAMERS, sugar ok) regular and decaf                             Plain Jell-O (NO RED)                                           Fruit ices (not with fruit pulp, NO RED)                                     Popsicles (NO RED)                                                               Sports drinks like Gatorade (NO RED)              Drink 2 Ensure/G2 drinks AT 10:00 PM the night before surgery.        The day of surgery:  Drink ONE (1) Pre-Surgery Clear Ensure BY 10:45  AM the morning of surgery. Drink in one sitting. Do not sip.  This drink was given to you during your hospital  pre-op appointment  visit. Nothing else to drink after completing the  Pre-Surgery Clear Ensure           If you have questions, please contact your surgeon's office.   FOLLOW BOWEL PREP AND ANY ADDITIONAL PRE OP INSTRUCTIONS YOU RECEIVED FROM YOUR SURGEON'S OFFICE!!!     Oral Hygiene is also important to reduce your risk of infection.  Remember - BRUSH YOUR TEETH THE MORNING OF SURGERY WITH YOUR REGULAR TOOTHPASTE  DENTURES WILL BE REMOVED PRIOR TO SURGERY PLEASE DO NOT APPLY Poly grip OR ADHESIVES!!!   Do NOT smoke after Midnight   Stop all vitamins and herbal supplements 7 days before surgery.   Take these medicines the morning of surgery with A SIP OF WATER: none    Bring CPAP mask and tubing day of surgery.                              You may not have any metal on your body including hair pins, jewelry, and body piercing             Do not wear make-up, lotions, powders, perfumes/, or deodorant  Do not wear nail polish including gel and S&S, artificial/acrylic nails, or any other type of covering on natural nails including finger and toenails. If you have artificial nails, gel coating, etc. that needs to be removed by a nail salon please have this removed prior to surgery or surgery may need to be canceled/ delayed if the surgeon/ anesthesia feels like they are unable to be safely monitored.   Do not shave  48 hours prior to surgery.         Do not bring valuables to the hospital. [facility] IS NOT             RESPONSIBLE   FOR VALUABLES.   Contacts, glasses, dentures or bridgework may not be worn into surgery.   Bring small overnight bag day of surgery.   DO NOT BRING YOUR HOME MEDICATIONS TO THE HOSPITAL. PHARMACY WILL DISPENSE MEDICATIONS LISTED ON YOUR MEDICATION LIST TO YOU DURING YOUR ADMISSION IN THE HOSPITAL!    Patients discharged on the day of surgery will not be allowed to drive home.  Someone NEEDS to stay with you for the first 24 hours  after anesthesia.   Special Instructions: Bring a copy of your healthcare power of attorney and living will documents the day of surgery if you haven't scanned them before.              Please read over the following fact sheets you were given: IF YOU HAVE QUESTIONS ABOUT YOUR PRE-OP INSTRUCTIONS PLEASE CALL [phone].    If you test positive for Covid or have been in contact with anyone that has tested positive in the last 10 days please notify you surgeon.    [facility] - Preparing for Surgery Before surgery, you can play an important role.  Because skin is not sterile, your skin needs to be as free of germs as possible.  You can reduce the number of germs on your skin by washing with CHG (chlorahexidine gluconate) soap before surgery.  CHG is an antiseptic cleaner which kills germs and bonds with the skin to continue killing germs even after washing. Please DO NOT use if you have an allergy to CHG or antibacterial soaps.  If your skin becomes reddened/irritated stop using the CHG and inform your nurse when you arrive at Short Stay. Do not shave (including legs and underarms) for at least 48 hours prior to the first CHG shower.  You may shave your face/neck.  Please follow these instructions carefully:  1.  Shower with CHG Soap the night before surgery ONLY (DO NOT USE THE SOAP THE MORNING OF SURGERY).  2.  If you choose to wash your hair, wash  your hair first as usual with your normal  shampoo.  3.  After you shampoo, rinse your hair and body thoroughly to remove the shampoo.                             4.  Use CHG as you would any other liquid soap.  You can apply chg directly to the skin and wash.  Gently with a scrungie or clean washcloth.  5.  Apply the CHG Soap to your body ONLY FROM THE NECK DOWN.   Do not use on face/ open                           Wound or open sores. Avoid contact with eyes, ears mouth and genitals (private parts).                       Wash face,  Genitals (private  parts) with your normal soap.             6.  Wash thoroughly, paying special attention to the area where your  surgery  will be performed.  7.  Thoroughly rinse your body with warm water from the neck down.  8.  DO NOT shower/wash with your normal soap after using and rinsing off the CHG Soap.                9.  Pat yourself dry with a clean towel.            10.  Wear clean pajamas.            11.  Place clean sheets on your bed the night of your first shower and do not  sleep with pets. Day of Surgery : Do not apply any CHG, lotions/deodorants the morning of surgery.  Please wear clean clothes to the hospital/surgery center.  FAILURE TO FOLLOW THESE INSTRUCTIONS MAY RESULT IN THE CANCELLATION OF YOUR SURGERY  PATIENT SIGNATURE_________________________________  NURSE SIGNATURE__________________________________  ________________________________________________________________________ WHAT IS A BLOOD TRANSFUSION? Blood Transfusion Information  A transfusion is the replacement of blood or some of its parts. Blood is made up of multiple cells which provide different functions. Red blood cells carry oxygen and are used for blood loss replacement. White blood cells fight against infection. Platelets control bleeding. Plasma helps clot blood. Other blood products are available for specialized needs, such as hemophilia or other clotting disorders. BEFORE THE TRANSFUSION  Who gives blood for transfusions?  Healthy volunteers who are fully evaluated to make sure their blood is safe. This is blood bank blood. Transfusion therapy is the safest it has ever been in the practice of medicine. Before blood is taken from a donor, a complete history is taken to make sure that person has no history of diseases nor engages in risky social behavior (examples are intravenous drug use or sexual activity with multiple partners). The donor's travel history is screened to minimize risk of transmitting infections,  such as malaria. The donated blood is tested for signs of infectious diseases, such as HIV and hepatitis. The blood is then tested to be sure it is compatible with you in order to minimize the chance of a transfusion reaction. If you or a relative donates blood, this is often done in anticipation of surgery and is not appropriate for emergency situations. It takes many days to process the donated blood. RISKS AND COMPLICATIONS  Although transfusion therapy is very safe and saves many lives, the main dangers of transfusion include:  Getting an infectious disease. Developing a transfusion reaction. This is an allergic reaction to something in the blood you were given. Every precaution is taken to prevent this. The decision to have a blood transfusion has been considered carefully by your caregiver before blood is given. Blood is not given unless the benefits outweigh the risks. AFTER THE TRANSFUSION Right after receiving a blood transfusion, you will usually feel much better and more energetic. This is especially true if your red blood cells have gotten low (anemic). The transfusion raises the level of the red blood cells which carry oxygen, and this usually causes an energy increase. The nurse administering the transfusion will monitor you carefully for complications. HOME CARE INSTRUCTIONS  No special instructions are needed after a transfusion. You may find your energy is better. Speak with your caregiver about any limitations on activity for underlying diseases you may have. SEEK MEDICAL CARE IF:  Your condition is not improving after your transfusion. You develop redness or irritation at the intravenous (IV) site. SEEK IMMEDIATE MEDICAL CARE IF:  Any of the following symptoms occur over the next 12 hours: Shaking chills. You have a temperature by mouth above 102 F (38.9 C), not controlled by medicine. Chest, back, or muscle pain. People around you feel you are not acting correctly or are  confused. Shortness of breath or difficulty breathing. Dizziness and fainting. You get a rash or develop hives. You have a decrease in urine output. Your urine turns a dark color or changes to pink, red, or brown. Any of the following symptoms occur over the next 10 days: You have a temperature by mouth above 102 F (38.9 C), not controlled by medicine. Shortness of breath. Weakness after normal activity. The white part of the eye turns yellow (jaundice). You have a decrease in the amount of urine or are urinating less often. Your urine turns a dark color or changes to pink, red, or brown. Document Released: 06/23/2000 Document Revised: 09/18/2011 Document Reviewed: 02/10/2008 [facility] Patient Information 2014 [place], [place].  _______________________________________________________________________

## 2024-06-09 NOTE — Progress Notes (Signed)
 PCP - Jodie Batch , PA   [place] Cardiologist - n/a  PPM/ICD -  Device Orders -  Rep Notified -   Chest x-ray -  EKG - preop Stress Test -  ECHO -  Cardiac Cath -   Sleep Study - n/a CPAP - n/a  Fasting Blood Sugar -  Checks Blood Sugar _n/a____ times a day  Blood Thinner Instructions:n/a Aspirin Instructions:n/a  ERAS Protcol - PRE-SURGERY Ensure    COVID vaccine -yes  Activity--Able to climb a flight of stairs with no CP or SOB Anesthesia review: HTN, recent crown will have temporary placed during surgery, See EKG  Patient denies shortness of breath, fever, cough and chest pain at PAT appointment   All instructions explained to the patient, with a verbal understanding of the material. Patient agrees to go over the instructions while at home for a better understanding. Patient also instructed to self quarantine after being tested for COVID-19. The opportunity to ask questions was provided.

## 2024-06-11 ENCOUNTER — Encounter (HOSPITAL_COMMUNITY)
Admission: RE | Admit: 2024-06-11 | Discharge: 2024-06-11 | Disposition: A | Source: Ambulatory Visit | Attending: Surgery

## 2024-06-11 ENCOUNTER — Other Ambulatory Visit: Payer: Self-pay

## 2024-06-11 ENCOUNTER — Encounter (HOSPITAL_COMMUNITY): Payer: Self-pay

## 2024-06-11 VITALS — BP 159/84 | HR 77 | Temp 97.9°F | Resp 16 | Ht 64.0 in | Wt 146.0 lb

## 2024-06-11 DIAGNOSIS — I10 Essential (primary) hypertension: Secondary | ICD-10-CM

## 2024-06-11 DIAGNOSIS — R739 Hyperglycemia, unspecified: Secondary | ICD-10-CM

## 2024-06-11 DIAGNOSIS — K6389 Other specified diseases of intestine: Secondary | ICD-10-CM

## 2024-06-11 DIAGNOSIS — Z01818 Encounter for other preprocedural examination: Secondary | ICD-10-CM

## 2024-06-11 HISTORY — DX: Personal history of urinary calculi: Z87.442

## 2024-06-11 HISTORY — DX: Family history of other specified conditions: Z84.89

## 2024-06-11 HISTORY — DX: Nausea with vomiting, unspecified: R11.2

## 2024-06-11 HISTORY — DX: Essential (primary) hypertension: I10

## 2024-06-11 HISTORY — DX: Other complications of anesthesia, initial encounter: T88.59XA

## 2024-06-11 LAB — CBC WITH DIFFERENTIAL/PLATELET
Abs Immature Granulocytes: 0.01 K/uL (ref 0.00–0.07)
Basophils Absolute: 0.1 K/uL (ref 0.0–0.1)
Basophils Relative: 1 %
Eosinophils Absolute: 0.1 K/uL (ref 0.0–0.5)
Eosinophils Relative: 2 %
HCT: 47.5 % — ABNORMAL HIGH (ref 36.0–46.0)
Hemoglobin: 15.5 g/dL — ABNORMAL HIGH (ref 12.0–15.0)
Immature Granulocytes: 0 %
Lymphocytes Relative: 34 %
Lymphs Abs: 1.7 K/uL (ref 0.7–4.0)
MCH: 28 pg (ref 26.0–34.0)
MCHC: 32.6 g/dL (ref 30.0–36.0)
MCV: 85.9 fL (ref 80.0–100.0)
Monocytes Absolute: 0.5 K/uL (ref 0.1–1.0)
Monocytes Relative: 10 %
Neutro Abs: 2.6 K/uL (ref 1.7–7.7)
Neutrophils Relative %: 53 %
Platelets: 170 K/uL (ref 150–400)
RBC: 5.53 MIL/uL — ABNORMAL HIGH (ref 3.87–5.11)
RDW: 13.3 % (ref 11.5–15.5)
WBC: 4.9 K/uL (ref 4.0–10.5)
nRBC: 0 % (ref 0.0–0.2)

## 2024-06-11 LAB — BASIC METABOLIC PANEL WITH GFR
Anion gap: 11 (ref 5–15)
BUN: 14 mg/dL (ref 8–23)
CO2: 26 mmol/L (ref 22–32)
Calcium: 10.5 mg/dL — ABNORMAL HIGH (ref 8.9–10.3)
Chloride: 103 mmol/L (ref 98–111)
Creatinine, Ser: 0.84 mg/dL (ref 0.44–1.00)
GFR, Estimated: >60 mL/min (ref >60)
Glucose, Bld: 94 mg/dL (ref 70–99)
Potassium: 4.1 mmol/L (ref 3.5–5.1)
Sodium: 140 mmol/L (ref 135–145)

## 2024-06-12 LAB — HEMOGLOBIN A1C
Hgb A1c MFr Bld: 5.4 % (ref 4.8–5.6)
Mean Plasma Glucose: 108 mg/dL

## 2024-06-12 LAB — CEA: CEA: 1.4 ng/mL (ref 0.0–4.7)

## 2024-06-20 ENCOUNTER — Inpatient Hospital Stay (HOSPITAL_COMMUNITY)
Admission: RE | Admit: 2024-06-20 | Discharge: 2024-06-24 | DRG: 330 | Disposition: A | Discharge status: Home / Self Care | Attending: Surgery | Admitting: Surgery

## 2024-06-20 ENCOUNTER — Inpatient Hospital Stay (HOSPITAL_COMMUNITY): Admitting: Anesthesiology

## 2024-06-20 ENCOUNTER — Encounter (HOSPITAL_COMMUNITY)
Admission: RE | Disposition: A | Discharge status: Home / Self Care | Payer: Self-pay | Source: Home / Self Care | Attending: Surgery

## 2024-06-20 ENCOUNTER — Encounter (HOSPITAL_COMMUNITY): Payer: Self-pay | Admitting: Surgery

## 2024-06-20 ENCOUNTER — Other Ambulatory Visit: Payer: Self-pay

## 2024-06-20 DIAGNOSIS — R197 Diarrhea, unspecified: Secondary | ICD-10-CM | POA: Diagnosis not present

## 2024-06-20 DIAGNOSIS — L4050 Arthropathic psoriasis, unspecified: Secondary | ICD-10-CM | POA: Diagnosis present

## 2024-06-20 DIAGNOSIS — Z860101 Personal history of adenomatous and serrated colon polyps: Secondary | ICD-10-CM | POA: Diagnosis not present

## 2024-06-20 DIAGNOSIS — K6389 Other specified diseases of intestine: Principal | ICD-10-CM | POA: Diagnosis present

## 2024-06-20 DIAGNOSIS — M8580 Other specified disorders of bone density and structure, unspecified site: Secondary | ICD-10-CM | POA: Diagnosis present

## 2024-06-20 DIAGNOSIS — K660 Peritoneal adhesions (postprocedural) (postinfection): Secondary | ICD-10-CM | POA: Diagnosis present

## 2024-06-20 DIAGNOSIS — Z79899 Other long term (current) drug therapy: Secondary | ICD-10-CM

## 2024-06-20 DIAGNOSIS — Z87442 Personal history of urinary calculi: Secondary | ICD-10-CM

## 2024-06-20 DIAGNOSIS — D84821 Immunodeficiency due to drugs: Secondary | ICD-10-CM | POA: Diagnosis present

## 2024-06-20 DIAGNOSIS — M1712 Unilateral primary osteoarthritis, left knee: Secondary | ICD-10-CM | POA: Diagnosis present

## 2024-06-20 DIAGNOSIS — I10 Essential (primary) hypertension: Secondary | ICD-10-CM | POA: Diagnosis present

## 2024-06-20 DIAGNOSIS — Z8249 Family history of ischemic heart disease and other diseases of the circulatory system: Secondary | ICD-10-CM | POA: Diagnosis not present

## 2024-06-20 DIAGNOSIS — Z791 Long term (current) use of non-steroidal anti-inflammatories (NSAID): Secondary | ICD-10-CM

## 2024-06-20 HISTORY — PX: COLECTOMY, RIGHT, ROBOT-ASSISTED: SHX7541

## 2024-06-20 LAB — TYPE AND SCREEN
ABO/RH(D): B POS
Antibody Screen: NEGATIVE

## 2024-06-20 LAB — ABO/RH: ABO/RH(D): B POS

## 2024-06-20 SURGERY — COLECTOMY, RIGHT, ROBOT-ASSISTED
Anesthesia: General | Site: Abdomen

## 2024-06-20 MED ORDER — METOPROLOL TARTRATE 5 MG/5ML IV SOLN: 5.0000 mg | Freq: Four times a day (QID) | INTRAVENOUS | Status: DC | PRN

## 2024-06-20 MED ORDER — GABAPENTIN 100 MG PO CAPS
200.0000 mg | ORAL_CAPSULE | Freq: Every day | ORAL | Status: DC
  Administered 2024-06-20 – 2024-06-23 (×4): 200 mg via ORAL
  Filled 2024-06-20 (×4): qty 2

## 2024-06-20 MED ORDER — PROCHLORPERAZINE MALEATE 10 MG PO TABS: 10.0000 mg | ORAL_TABLET | Freq: Four times a day (QID) | ORAL | Status: DC | PRN

## 2024-06-20 MED ORDER — TRAMADOL HCL 50 MG PO TABS
50.0000 mg | ORAL_TABLET | Freq: Four times a day (QID) | ORAL | Status: DC | PRN
  Administered 2024-06-21 (×2): 50 mg via ORAL
  Filled 2024-06-20 (×2): qty 1

## 2024-06-20 MED ORDER — ONDANSETRON HCL 4 MG PO TABS: 4.0000 mg | ORAL_TABLET | Freq: Four times a day (QID) | ORAL | Status: DC | PRN

## 2024-06-20 MED ORDER — DICLOFENAC SODIUM 1 % EX GEL: 1.0000 | Freq: Four times a day (QID) | CUTANEOUS | Status: DC | PRN

## 2024-06-20 MED ORDER — FENTANYL CITRATE (PF) 50 MCG/ML IJ SOSY: 25.0000 ug | PREFILLED_SYRINGE | INTRAMUSCULAR | Status: DC | PRN

## 2024-06-20 MED ORDER — SODIUM CHLORIDE 0.9 % IV SOLN
2.0000 g | INTRAVENOUS | Status: AC
  Administered 2024-06-20: 16:00:00 2 g via INTRAVENOUS
  Filled 2024-06-20: qty 2

## 2024-06-20 MED ORDER — BUPIVACAINE-EPINEPHRINE (PF) 0.25% -1:200000 IJ SOLN
INTRAMUSCULAR | Status: DC | PRN
  Administered 2024-06-20: 40 mL

## 2024-06-20 MED ORDER — LIDOCAINE HCL (PF) 2 % IJ SOLN
INTRAMUSCULAR | Status: AC
  Filled 2024-06-20: qty 5

## 2024-06-20 MED ORDER — SODIUM CHLORIDE 0.9 % IV SOLN: Freq: Three times a day (TID) | INTRAVENOUS | Status: AC | PRN

## 2024-06-20 MED ORDER — LACTATED RINGERS IV SOLN: INTRAVENOUS | Status: DC | PRN

## 2024-06-20 MED ORDER — PHENOL 1.4 % MT LIQD: 2.0000 | OROMUCOSAL | Status: DC | PRN

## 2024-06-20 MED ORDER — MEPERIDINE HCL 25 MG/ML IJ SOLN: 6.2500 mg | INTRAMUSCULAR | Status: DC | PRN

## 2024-06-20 MED ORDER — ROCURONIUM BROMIDE 10 MG/ML (PF) SYRINGE
PREFILLED_SYRINGE | INTRAVENOUS | Status: AC
  Filled 2024-06-20: qty 10

## 2024-06-20 MED ORDER — ENSURE PRE-SURGERY PO LIQD
296.0000 mL | Freq: Once | ORAL | Status: DC
  Filled 2024-06-20: qty 296

## 2024-06-20 MED ORDER — DIPHENHYDRAMINE HCL 12.5 MG/5ML PO ELIX: 12.5000 mg | ORAL_SOLUTION | Freq: Four times a day (QID) | ORAL | Status: DC | PRN

## 2024-06-20 MED ORDER — SALINE SPRAY 0.65 % NA SOLN: 1.0000 | Freq: Four times a day (QID) | NASAL | Status: DC | PRN

## 2024-06-20 MED ORDER — ENSURE SURGERY PO LIQD
237.0000 mL | Freq: Two times a day (BID) | ORAL | Status: DC
  Administered 2024-06-21 – 2024-06-22 (×2): 237 mL via ORAL

## 2024-06-20 MED ORDER — PROCHLORPERAZINE EDISYLATE 10 MG/2ML IJ SOLN: 5.0000 mg | Freq: Four times a day (QID) | INTRAMUSCULAR | Status: DC | PRN

## 2024-06-20 MED ORDER — ONDANSETRON HCL 4 MG/2ML IJ SOLN: 4.0000 mg | Freq: Four times a day (QID) | INTRAMUSCULAR | Status: DC | PRN

## 2024-06-20 MED ORDER — NAPHAZOLINE-GLYCERIN 0.012-0.25 % OP SOLN: 1.0000 [drp] | Freq: Four times a day (QID) | OPHTHALMIC | Status: DC | PRN

## 2024-06-20 MED ORDER — FENTANYL CITRATE (PF) 100 MCG/2ML IJ SOLN
INTRAMUSCULAR | Status: DC | PRN
  Administered 2024-06-20: 100 ug via INTRAVENOUS

## 2024-06-20 MED ORDER — DIPHENHYDRAMINE HCL 50 MG/ML IJ SOLN: 12.5000 mg | Freq: Four times a day (QID) | INTRAMUSCULAR | Status: DC | PRN

## 2024-06-20 MED ORDER — BISACODYL 5 MG PO TBEC: 20.0000 mg | DELAYED_RELEASE_TABLET | Freq: Once | ORAL | Status: DC

## 2024-06-20 MED ORDER — ENOXAPARIN SODIUM 40 MG/0.4ML IJ SOSY
40.0000 mg | PREFILLED_SYRINGE | Freq: Once | INTRAMUSCULAR | Status: AC
  Administered 2024-06-20: 40 mg via SUBCUTANEOUS
  Filled 2024-06-20: qty 0.4

## 2024-06-20 MED ORDER — ENOXAPARIN SODIUM 40 MG/0.4ML IJ SOSY
40.0000 mg | PREFILLED_SYRINGE | INTRAMUSCULAR | Status: DC
  Administered 2024-06-21 – 2024-06-24 (×4): 40 mg via SUBCUTANEOUS
  Filled 2024-06-20 (×4): qty 0.4

## 2024-06-20 MED ORDER — LACTATED RINGERS IV SOLN: INTRAVENOUS | Status: DC

## 2024-06-20 MED ORDER — SUGAMMADEX SODIUM 200 MG/2ML IV SOLN
INTRAVENOUS | Status: AC
  Filled 2024-06-20: qty 2

## 2024-06-20 MED ORDER — MENTHOL 3 MG MT LOZG: 1.0000 | LOZENGE | OROMUCOSAL | Status: DC | PRN

## 2024-06-20 MED ORDER — ONDANSETRON HCL 4 MG/2ML IJ SOLN
INTRAMUSCULAR | Status: AC
  Filled 2024-06-20: qty 2

## 2024-06-20 MED ORDER — LACTATED RINGERS IV BOLUS
1000.0000 mL | Freq: Three times a day (TID) | INTRAVENOUS | Status: AC | PRN
  Administered 2024-06-21: 1000 mL via INTRAVENOUS

## 2024-06-20 MED ORDER — BUPIVACAINE LIPOSOME 1.3 % IJ SUSP: 20.0000 mL | Freq: Once | INTRAMUSCULAR | Status: DC

## 2024-06-20 MED ORDER — SODIUM CHLORIDE 0.9 % IR SOLN
Status: DC | PRN
  Administered 2024-06-20: 1000 mL

## 2024-06-20 MED ORDER — OXYCODONE HCL 5 MG PO TABS: 5.0000 mg | ORAL_TABLET | Freq: Once | ORAL | Status: DC | PRN

## 2024-06-20 MED ORDER — ENSURE PRE-SURGERY PO LIQD
592.0000 mL | Freq: Once | ORAL | Status: DC
  Filled 2024-06-20: qty 592

## 2024-06-20 MED ORDER — NEOMYCIN SULFATE 500 MG PO TABS
1000.0000 mg | ORAL_TABLET | ORAL | Status: DC
  Filled 2024-06-20: qty 2

## 2024-06-20 MED ORDER — ACETAMINOPHEN 500 MG PO TABS
1000.0000 mg | ORAL_TABLET | ORAL | Status: AC
  Administered 2024-06-20: 1000 mg via ORAL
  Filled 2024-06-20: qty 2

## 2024-06-20 MED ORDER — SODIUM CHLORIDE 0.9% FLUSH: 3.0000 mL | INTRAVENOUS | Status: DC | PRN

## 2024-06-20 MED ORDER — MELATONIN 3 MG PO TABS
3.0000 mg | ORAL_TABLET | Freq: Every evening | ORAL | Status: DC | PRN
  Administered 2024-06-21: 3 mg via ORAL
  Filled 2024-06-20: qty 1

## 2024-06-20 MED ORDER — TRAMADOL HCL 50 MG PO TABS: 50.0000 mg | ORAL_TABLET | Freq: Four times a day (QID) | ORAL | 0 refills | Status: DC | PRN

## 2024-06-20 MED ORDER — OXYCODONE HCL 5 MG/5ML PO SOLN: 5.0000 mg | Freq: Once | ORAL | Status: DC | PRN

## 2024-06-20 MED ORDER — MAGIC MOUTHWASH: 15.0000 mL | Freq: Four times a day (QID) | ORAL | Status: DC | PRN

## 2024-06-20 MED ORDER — GABAPENTIN 100 MG PO CAPS
200.0000 mg | ORAL_CAPSULE | ORAL | Status: AC
  Administered 2024-06-20: 200 mg via ORAL
  Filled 2024-06-20: qty 2

## 2024-06-20 MED ORDER — FENTANYL CITRATE (PF) 100 MCG/2ML IJ SOLN
INTRAMUSCULAR | Status: AC
  Filled 2024-06-20: qty 2

## 2024-06-20 MED ORDER — PROPOFOL 10 MG/ML IV BOLUS
INTRAVENOUS | Status: AC
  Filled 2024-06-20: qty 20

## 2024-06-20 MED ORDER — LIDOCAINE 2% (20 MG/ML) 5 ML SYRINGE
INTRAMUSCULAR | Status: DC | PRN
  Administered 2024-06-20: 80 mg via INTRAVENOUS

## 2024-06-20 MED ORDER — POLYETHYLENE GLYCOL 3350 17 GM/SCOOP PO POWD
238.0000 g | Freq: Once | ORAL | Status: DC
  Filled 2024-06-20: qty 238

## 2024-06-20 MED ORDER — SUGAMMADEX SODIUM 200 MG/2ML IV SOLN
INTRAVENOUS | Status: DC | PRN
  Administered 2024-06-20: 200 mg via INTRAVENOUS

## 2024-06-20 MED ORDER — LISINOPRIL 10 MG PO TABS
10.0000 mg | ORAL_TABLET | Freq: Every day | ORAL | Status: DC
  Administered 2024-06-21 – 2024-06-24 (×4): 10 mg via ORAL
  Filled 2024-06-20 (×4): qty 1

## 2024-06-20 MED ORDER — HYDRALAZINE HCL 20 MG/ML IJ SOLN: 10.0000 mg | INTRAMUSCULAR | Status: DC | PRN

## 2024-06-20 MED ORDER — ONDANSETRON HCL 4 MG/2ML IJ SOLN
INTRAMUSCULAR | Status: DC | PRN
  Administered 2024-06-20: 4 mg via INTRAVENOUS

## 2024-06-20 MED ORDER — BUPIVACAINE-EPINEPHRINE (PF) 0.25% -1:200000 IJ SOLN
INTRAMUSCULAR | Status: AC
  Filled 2024-06-20: qty 60

## 2024-06-20 MED ORDER — SODIUM CHLORIDE 0.9 % IV SOLN
2.0000 g | Freq: Two times a day (BID) | INTRAVENOUS | Status: AC
  Administered 2024-06-21: 2 g via INTRAVENOUS
  Filled 2024-06-20: qty 2

## 2024-06-20 MED ORDER — LACTATED RINGERS IR SOLN
Status: DC | PRN
  Administered 2024-06-20: 1000 mL

## 2024-06-20 MED ORDER — KETAMINE HCL 10 MG/ML IJ SOLN
INTRAMUSCULAR | Status: DC | PRN
  Administered 2024-06-20: 10 mg via INTRAVENOUS
  Administered 2024-06-20: 20 mg via INTRAVENOUS

## 2024-06-20 MED ORDER — METRONIDAZOLE 500 MG PO TABS
1000.0000 mg | ORAL_TABLET | ORAL | Status: DC
  Filled 2024-06-20: qty 2

## 2024-06-20 MED ORDER — ALVIMOPAN 12 MG PO CAPS
12.0000 mg | ORAL_CAPSULE | ORAL | Status: AC
  Administered 2024-06-20: 12 mg via ORAL
  Filled 2024-06-20: qty 1

## 2024-06-20 MED ORDER — ONDANSETRON HCL 4 MG/2ML IJ SOLN: 4.0000 mg | Freq: Once | INTRAMUSCULAR | Status: DC | PRN

## 2024-06-20 MED ORDER — ALVIMOPAN 12 MG PO CAPS
12.0000 mg | ORAL_CAPSULE | Freq: Two times a day (BID) | ORAL | Status: DC
  Administered 2024-06-21: 12 mg via ORAL
  Filled 2024-06-20: qty 1

## 2024-06-20 MED ORDER — ACETAMINOPHEN 500 MG PO TABS
1000.0000 mg | ORAL_TABLET | Freq: Four times a day (QID) | ORAL | Status: DC
  Administered 2024-06-20 – 2024-06-24 (×11): 1000 mg via ORAL
  Filled 2024-06-20 (×13): qty 2

## 2024-06-20 MED ORDER — KETAMINE HCL 50 MG/5ML IJ SOSY
PREFILLED_SYRINGE | INTRAMUSCULAR | Status: AC
  Filled 2024-06-20: qty 5

## 2024-06-20 MED ORDER — PROPOFOL 10 MG/ML IV BOLUS
INTRAVENOUS | Status: DC | PRN
  Administered 2024-06-20: 125 ug/kg/min via INTRAVENOUS
  Administered 2024-06-20: 100 mg via INTRAVENOUS

## 2024-06-20 MED ORDER — NAPROXEN 250 MG PO TABS: 250.0000 mg | ORAL_TABLET | Freq: Two times a day (BID) | ORAL | Status: DC | PRN

## 2024-06-20 MED ORDER — ALUM & MAG HYDROXIDE-SIMETH 200-200-20 MG/5ML PO SUSP: 30.0000 mL | Freq: Four times a day (QID) | ORAL | Status: DC | PRN

## 2024-06-20 MED ORDER — PHENYLEPHRINE HCL (PRESSORS) 10 MG/ML IV SOLN
INTRAVENOUS | Status: AC
  Filled 2024-06-20: qty 1

## 2024-06-20 MED ORDER — ORAL CARE MOUTH RINSE: 15.0000 mL | Freq: Once | OROMUCOSAL | Status: DC

## 2024-06-20 MED ORDER — SIMETHICONE 80 MG PO CHEW
40.0000 mg | CHEWABLE_TABLET | Freq: Four times a day (QID) | ORAL | Status: DC | PRN
  Administered 2024-06-21: 40 mg via ORAL
  Filled 2024-06-20: qty 1

## 2024-06-20 MED ORDER — HYDROCHLOROTHIAZIDE 12.5 MG PO TABS
12.5000 mg | ORAL_TABLET | Freq: Every day | ORAL | Status: DC
  Administered 2024-06-21 – 2024-06-24 (×4): 12.5 mg via ORAL
  Filled 2024-06-20 (×4): qty 1

## 2024-06-20 MED ORDER — CHLORHEXIDINE GLUCONATE 0.12 % MT SOLN: 15.0000 mL | Freq: Once | OROMUCOSAL | Status: DC

## 2024-06-20 MED ORDER — HYDROMORPHONE HCL 1 MG/ML IJ SOLN: 0.5000 mg | INTRAMUSCULAR | Status: DC | PRN

## 2024-06-20 MED ORDER — SPY AGENT GREEN - (INDOCYANINE FOR INJECTION)
INTRAMUSCULAR | Status: DC | PRN
  Administered 2024-06-20: 3 mL via INTRAVENOUS

## 2024-06-20 MED ORDER — KCL IN DEXTROSE-NACL 20-5-0.45 MEQ/L-%-% IV SOLN
INTRAVENOUS | Status: AC
  Filled 2024-06-20 (×2): qty 1000

## 2024-06-20 MED ADMIN — Dexamethasone Sod Phosphate Preservative Free Inj 10 MG/ML: 10 mg | INTRAVENOUS | NDC 25021005301

## 2024-06-20 MED ADMIN — Calcium Polycarbophil Tab 625 MG: 625 mg | ORAL | NDC 00904250091

## 2024-06-20 MED ADMIN — Sodium Chloride Flush IV Soln 0.9%: 3 mL | INTRAVENOUS | NDC 8881579123

## 2024-06-20 MED ADMIN — Rocuronium Bromide IV Soln Pref Syr 100 MG/10ML (10 MG/ML): 70 mg | INTRAVENOUS | NDC 99999070048

## 2024-06-20 MED FILL — Calcium Polycarbophil Tab 625 MG: 625.0000 mg | ORAL | Qty: 1 | Status: AC

## 2024-06-20 MED FILL — Phenylephrine HCl IV Soln 10 MG/ML: INTRAVENOUS | Qty: 1 | Status: AC

## 2024-06-20 SURGICAL SUPPLY — 82 items
BAG COUNTER SPONGE SURGICOUNT (BAG) ×1 IMPLANT
BLADE EXTENDED COATED 6.5IN (ELECTRODE) IMPLANT
CANNULA REDUCER 12-8 DVNC XI (CANNULA) IMPLANT
CHLORAPREP W/TINT 26 (MISCELLANEOUS) IMPLANT
CLIP APPLIE 5 13 M/L LIGAMAX5 (MISCELLANEOUS) IMPLANT
CLIP APPLIE ROT 10 11.4 M/L (STAPLE) IMPLANT
COVER SURGICAL LIGHT HANDLE (MISCELLANEOUS) ×2 IMPLANT
COVER TIP SHEARS 8 DVNC (MISCELLANEOUS) ×1 IMPLANT
DEFOGGER SCOPE WARM SEASHARP (MISCELLANEOUS) ×1 IMPLANT
DEVICE TROCAR PUNCTURE CLOSURE (ENDOMECHANICALS) IMPLANT
DRAIN CHANNEL 19F RND (DRAIN) IMPLANT
DRAPE ARM DVNC X/XI (DISPOSABLE) ×4 IMPLANT
DRAPE COLUMN DVNC XI (DISPOSABLE) ×1 IMPLANT
DRAPE CV SPLIT W-CLR ANES SCRN (DRAPES) ×1 IMPLANT
DRAPE PERI GROIN 82X75IN TIB (DRAPES) ×1 IMPLANT
DRAPE SURG IRRIG POUCH 19X23 (DRAPES) ×1 IMPLANT
DRIVER NDL LRG 8 DVNC XI (INSTRUMENTS) ×1 IMPLANT
DRIVER NDLE LRG 8 DVNC XI (INSTRUMENTS) ×1 IMPLANT
DRSG OPSITE POSTOP 4X10 (GAUZE/BANDAGES/DRESSINGS) IMPLANT
DRSG OPSITE POSTOP 4X6 (GAUZE/BANDAGES/DRESSINGS) IMPLANT
DRSG OPSITE POSTOP 4X8 (GAUZE/BANDAGES/DRESSINGS) IMPLANT
DRSG TEGADERM 2-3/8X2-3/4 SM (GAUZE/BANDAGES/DRESSINGS) ×5 IMPLANT
DRSG TEGADERM 4X4.75 (GAUZE/BANDAGES/DRESSINGS) IMPLANT
ELECT PENCIL ROCKER SW 15FT (MISCELLANEOUS) ×1 IMPLANT
ELECT REM PT RETURN 15FT ADLT (MISCELLANEOUS) ×1 IMPLANT
EVACUATOR SILICONE 100CC (DRAIN) IMPLANT
GAUZE SPONGE 2X2 8PLY STRL LF (GAUZE/BANDAGES/DRESSINGS) ×1 IMPLANT
GLOVE ECLIPSE 8.0 STRL XLNG CF (GLOVE) ×3 IMPLANT
GLOVE INDICATOR 8.0 STRL GRN (GLOVE) ×3 IMPLANT
GOWN SRG XL LVL 4 BRTHBL STRL (GOWNS) ×1 IMPLANT
GOWN STRL REUS W/ TWL XL LVL3 (GOWN DISPOSABLE) ×4 IMPLANT
GRASPER SUT TROCAR 14GX15 (MISCELLANEOUS) IMPLANT
GRASPER TIP-UP FEN DVNC XI (INSTRUMENTS) ×1 IMPLANT
HOLDER FOLEY CATH W/STRAP (MISCELLANEOUS) ×1 IMPLANT
IRRIGATION SUCT STRKRFLW 2 WTP (MISCELLANEOUS) ×1 IMPLANT
KIT PROCEDURE DVNC SI (MISCELLANEOUS) IMPLANT
KIT SIGMOIDOSCOPE (SET/KITS/TRAYS/PACK) IMPLANT
KIT TURNOVER KIT A (KITS) ×1 IMPLANT
NDL INSUFFLATION 14GA 120MM (NEEDLE) ×1 IMPLANT
NDL SPNL 20GX3.5 QUINCKE YW (NEEDLE) ×1 IMPLANT
NEEDLE INSUFFLATION 14GA 120MM (NEEDLE) ×1 IMPLANT
NEEDLE SPNL 20GX3.5 QUINCKE YW (NEEDLE) ×1 IMPLANT
PACK COLON (CUSTOM PROCEDURE TRAY) ×1 IMPLANT
PAD POSITIONING PINK XL (MISCELLANEOUS) ×1 IMPLANT
PROTECTOR NERVE ULNAR (MISCELLANEOUS) ×2 IMPLANT
RELOAD STAPLE 60 2.5 WHT DVNC (STAPLE) IMPLANT
RELOAD STAPLE 60 3.5 BLU DVNC (STAPLE) IMPLANT
RELOAD STAPLE 60 4.3 GRN DVNC (STAPLE) IMPLANT
RETRACTOR WND ALEXIS 18 MED (MISCELLANEOUS) IMPLANT
SCISSORS LAP 5X35 DISP (ENDOMECHANICALS) ×1 IMPLANT
SCISSORS MNPLR CVD DVNC XI (INSTRUMENTS) ×1 IMPLANT
SEAL UNIV 5-12 XI (MISCELLANEOUS) ×4 IMPLANT
SEALER VESSEL EXT DVNC XI (MISCELLANEOUS) ×1 IMPLANT
SOLUTION ELECTROSURG ANTI STCK (MISCELLANEOUS) ×1 IMPLANT
SPIKE FLUID TRANSFER (MISCELLANEOUS) ×1 IMPLANT
STAPLER 60 SUREFORM DVNC (STAPLE) IMPLANT
STAPLER ECHELON POWER CIR 29 (STAPLE) IMPLANT
STAPLER ECHELON POWER CIR 31 (STAPLE) IMPLANT
STOPCOCK 4 WAY LG BORE MALE ST (IV SETS) ×2 IMPLANT
SURGILUBE 2OZ TUBE FLIPTOP (MISCELLANEOUS) IMPLANT
SUT MNCRL AB 4-0 PS2 18 (SUTURE) ×1 IMPLANT
SUT PDS AB 1 CT1 27 (SUTURE) ×2 IMPLANT
SUT PROLENE 0 CT 2 (SUTURE) IMPLANT
SUT PROLENE 2 0 KS (SUTURE) IMPLANT
SUT PROLENE 2 0 SH DA (SUTURE) IMPLANT
SUT SILK 2 0 SH CR/8 (SUTURE) IMPLANT
SUT SILK 3 0 SH CR/8 (SUTURE) IMPLANT
SUT VIC AB 2-0 SH 18 (SUTURE) ×1 IMPLANT
SUT VIC AB 3-0 SH 18 (SUTURE) IMPLANT
SUT VIC AB 3-0 SH 27XBRD (SUTURE) IMPLANT
SUT VICRYL 0 UR6 27IN ABS (SUTURE) IMPLANT
SUT VLOC 180 2-0 9IN GS21 (SUTURE) IMPLANT
SUTURE V-LC BRB 180 2/0GR6GS22 (SUTURE) IMPLANT
SYR 20ML ECCENTRIC (SYRINGE) ×1 IMPLANT
SYSTEM LAPSCP GELPORT 120MM (MISCELLANEOUS) IMPLANT
SYSTEM WOUND ALEXIS 18CM MED (MISCELLANEOUS) ×1 IMPLANT
TAPE UMBILICAL 1/8 X36 TWILL (MISCELLANEOUS) ×1 IMPLANT
TRAY FOLEY MTR SLVR 14FR STAT (SET/KITS/TRAYS/PACK) IMPLANT
TRAY FOLEY MTR SLVR 16FR STAT (SET/KITS/TRAYS/PACK) ×1 IMPLANT
TROCAR ADV FIXATION 5X100MM (TROCAR) ×1 IMPLANT
TUBING CONNECTING 10 (TUBING) ×2 IMPLANT
TUBING INSUFFLATION 10FT LAP (TUBING) ×1 IMPLANT

## 2024-06-20 NOTE — Interval H&P Note (Signed)
 History and Physical Interval Note:  06/20/2024 12:49 PM  Katie Tanner  has presented today for surgery, with the diagnosis of CECAL MASS.  The various methods of treatment have been discussed with the patient and family. After consideration of risks, benefits and other options for treatment, the patient has consented to  Procedures with comments: COLECTOMY, RIGHT, ROBOT-ASSISTED (N/A) - ROBOTIC COLECTOMY PROXIMAL as a surgical intervention.  The patient's history has been reviewed, patient examined, no change in status, stable for surgery.  I have reviewed the patient's chart and labs.  Questions were answered to the patient's satisfaction.    I have re-reviewed the the patient's records, history, medications, and allergies.  I have re-examined the patient.  I again discussed intraoperative plans and goals of post-operative recovery.  The patient agrees to proceed.  Katie Tanner  [birth year] [id number]  Patient Care Team: Pridgen, Taylar, NP as PCP - General (Family Medicine) Livingston Rigg, MD as Consulting Physician (Dermatology) Sheldon Standing, MD as Consulting Physician (General Surgery) Dianna Specking, MD as Consulting Physician (Gastroenterology) Marvene Prentice SAUNDERS, FNP (Family Medicine)  Patient Active Problem List   Diagnosis Date Noted   Hypertensive disorder 05/21/2024   Osteopenia 05/21/2024   Psoriatic arthritis (HCC) 05/21/2024   Osteoarthritis of left knee 05/21/2024   Mass of cecum 05/21/2024   History of colonic polyps 05/21/2024    Past Medical History:  Diagnosis Date   Complication of anesthesia    Family health problem    mother had chrons and history of blood clot   History of kidney stones    Hypertension    PONV (postoperative nausea and vomiting)    Psoriasis     Past Surgical History:  Procedure Laterality Date   APPENDECTOMY  1996   CHOLECYSTECTOMY  1999   laparoscopic   MENISCUS REPAIR Left 2010   TUBAL LIGATION  1977    Social History    Socioeconomic History   Marital status: Single    Spouse name: Not on file   Number of children: Not on file   Years of education: Not on file   Highest education level: Not on file  Occupational History   Not on file  Tobacco Use   Smoking status: Never   Smokeless tobacco: Never  Vaping Use   Vaping status: Never Used  Substance and Sexual Activity   Alcohol use: Not Currently   Drug use: Never   Sexual activity: Not on file  Other Topics Concern   Not on file  Social History Narrative   Not on file   Social Drivers of Health   Tobacco Use: Low Risk (06/20/2024)   Patient History    Smoking Tobacco Use: Never    Smokeless Tobacco Use: Never    Passive Exposure: Not on file  Financial Resource Strain: Not on file  Food Insecurity: Not on file  Transportation Needs: Not on file  Physical Activity: Not on file  Stress: No Stress Concern Present (08/16/2021)   Received from [facility] Occup[facility] - Occupational Stress Questionnaire    Feeling of Stress : Only a little  Social Connections: Not on file  Intimate Partner Violence: Not on file  Depression (EYV7-0): Not on file  Alcohol Screen: Not on file  Housing: Unknown (05/21/2024)   Received from [facility]   Epic    At any time in the past 12 months, were you homeless or living in a shelter (including now)?: No  Number of Times Moved in the Last Year: Not on file    Unable to Pay for Housing in the Last Year: Not on file  Utilities: Not on file  Health Literacy: Not on file    History reviewed. No pertinent family history.  Medications Prior to Admission  Medication Sig Dispense Refill Last Dose/Taking   acetaminophen  (TYLENOL ) 650 MG CR tablet Take 650 mg by mouth every 8 (eight) hours as needed for pain.   06/19/2024   Calcium-Phosphorus-Vitamin D (CALCIUM GUMMIES PO) Take 2 each by mouth in the morning.   06/13/2024   Cholecalciferol (VITAMIN D3) 125  MCG (5000 UT) TABS Take 5,000 Units by mouth in the morning.   06/13/2024   diclofenac Sodium (VOLTAREN) 1 % GEL Apply 1 Application topically 4 (four) times daily as needed (pain.).   Past Week   FIBER GUMMIES PO Take 3 each by mouth in the morning.   06/13/2024   guselkumab (TREMFYA) 100 MG/ML pen Inject 1 mL into the skin every 8 (eight) weeks.   06/02/2024   lisinopril-hydrochlorothiazide (ZESTORETIC) 10-12.5 MG tablet Take 1 tablet by mouth in the morning.   06/19/2024   MELATONIN PO Take 15 mg by mouth at bedtime as needed (sleep).   06/13/2024   mupirocin ointment (BACTROBAN) 2 % Apply 1 Application topically daily.   Past Week   Probiotic Product (PROBIOTIC-10 PO) Take 1 capsule by mouth in the morning.   06/13/2024   meloxicam (MOBIC) 15 MG tablet Take 15 mg by mouth daily as needed for pain.   More than a month    Current Facility-Administered Medications  Medication Dose Route Frequency Provider Last Rate Last Admin   bisacodyl (DULCOLAX) EC tablet 20 mg  20 mg Oral Once Sheldon Standing, MD       bupivacaine liposome (EXPAREL) 1.3 % injection 266 mg  20 mL Infiltration Once Sheldon Standing, MD       cefoTEtan (CEFOTAN) 2 g in sodium chloride  0.9 % 100 mL IVPB  2 g Intravenous On Call to OR Sheldon Standing, MD       chlorhexidine (PERIDEX) 0.12 % solution 15 mL  15 mL Mouth/Throat Once Jerrye Sharper, MD       Or   Oral care mouth rinse  15 mL Mouth Rinse Once Jerrye Sharper, MD       NOREEN ON 06/21/2024] feeding supplement (ENSURE PRE-SURGERY) liquid 296 mL  296 mL Oral Once Sheldon Standing, MD       feeding supplement (ENSURE PRE-SURGERY) liquid 592 mL  592 mL Oral Once Sheldon Standing, MD       lactated ringers infusion   Intravenous Continuous Jerrye Sharper, MD 10 mL/hr at 06/20/24 1245 New Bag at 06/20/24 1245   neomycin (MYCIFRADIN) tablet 1,000 mg  1,000 mg Oral 3 times per day Sheldon Standing, MD       And   metroNIDAZOLE (FLAGYL) tablet 1,000 mg  1,000 mg Oral 3 times per day Sheldon Standing, MD       polyethylene glycol powder (GLYCOLAX/MIRALAX) container 238 g  238 g Oral Once Sheldon Standing, MD         Allergies[1]  BP (!) 173/85 (BP Location: Right Arm)   Pulse 96   Temp (!) 97.5 F (36.4 C) (Oral)   Resp 16   Ht 5' 4 (1.626 m)   Wt 66 kg   SpO2 96%   BMI 24.98 kg/m   Labs: No results found for this or any previous  visit (from the past 48 hours).  Imaging / Studies: No results found.   Briant KYM Schultze, M.D., F.A.C.S. Gastrointestinal and Minimally Invasive Surgery [facility] [address], [address] [place], [place] [address] [phone] Main / Paging  06/20/2024 12:50 PM     Elspeth JAYSON Schultze      [1] No Known Allergies

## 2024-06-20 NOTE — Op Note (Signed)
 06/20/2024  6:10 PM  PATIENT:  Katie Tanner  73 y.o. female  Patient Care Team: Pridgen, Taylar, NP as PCP - General (Family Medicine) Livingston Rigg, MD as Consulting Physician (Dermatology) Sheldon Standing, MD as Consulting Physician (General Surgery) Dianna Specking, MD as Consulting Physician (Gastroenterology) Marvene Prentice SAUNDERS, FNP (Family Medicine)  PRE-OPERATIVE DIAGNOSIS:  CECAL MASS  POST-OPERATIVE DIAGNOSIS:  CECAL MASS  PROCEDURE:   -ROBOTIC PROXIMAL RIGHT COLECTOMY WITH ANASTOMOSIS -INTRAOPERATIVE ASSESSMENT OF TISSUE VASCULAR PERFUSION USING ICG (indocyanine green) IMMUNOFLUORESCENCE -TRANSVERSUS ABDOMINIS PLANE (TAP) BLOCK - BILATERAL  SURGEON:  Standing KYM Sheldon, MD  ASSISTANT:  Medford Pizza, MD  An experienced assistant was required given the standard of surgical care given the complexity of the case.  This assistant was needed for exposure, dissection, suction, tissue approximation, retraction, perception, etc  ANESTHESIA:  General endotracheal intubation anesthesia (GETA) and Regional TRANSVERSUS ABDOMINIS PLANE (TAP) nerve block -BILATERAL for perioperative & postoperative pain control at the level of the transverse abdominis & preperitoneal spaces along the flank at the anterior axillary line, from subcostal ridge to iliac crest under laparoscopic guidance provided with 60 mL of bupivicaine 0.25% with epinephrine  Estimated Blood Loss (EBL):   Total I/O In: 1400 [I.V.:1400] Out: 275 [Urine:175; Blood:100].   (See anesthesia record)  Delay start of Pharmacological VTE agent (>24hrs) due to concerns of significant anemia, surgical blood loss, or risk of bleeding?:  no  DRAINS: (None)  SPECIMEN:  Colon - proximal right  DISPOSITION OF SPECIMEN:  Pathology  COUNTS:  Sponge, needle, & instrument counts CORRECT at the conclusion of the case.      PLAN OF CARE: Admit to inpatient   PATIENT DISPOSITION:  PACU - hemodynamically stable.  INDICATION:     Pleasant patient found to have mass near cecum with adenomatous changes.  Rather bulky not felt amenable to endoscopic resection.  Surgical consultation requested I recommended segmental resection:  The anatomy & physiology of the digestive tract was discussed.  The pathophysiology was discussed.  Natural history risks without surgery was discussed.   I worked to give an overview of the disease and the frequent need to have multispecialty involvement.  I feel the risks of no intervention will lead to serious problems that outweigh the operative risks; therefore, I recommended a partial colectomy to remove the pathology.  Laparoscopic & open techniques were discussed.   Risks such as bleeding, infection, abscess, leak, reoperation, possible ostomy, hernia, heart attack, death, and other risks were discussed.  I noted a good likelihood this will help address the problem.   Goals of post-operative recovery were discussed as well.  We will work to minimize complications.  An educational handout on the pathology was given as well.  Questions were answered.    The patient expresses understanding & wishes to proceed with surgery.  OR FINDINGS:   Patient had some moderate adhesions in the lower abdomen.  A soft mass on the mesenteric side of the cecum near the ileocecal valve. No obvious metastatic disease on visceral parietal peritoneum or liver.  It is an isoperistaltic ileocolonic anastomosis that rests in the periumbilical region.  CASE DATA: Type of patient?: Elective [facility] Private Case Status of Case? Elective Scheduled Infection Present At Time Of Surgery (PATOS)?  NO     DESCRIPTION:   Informed consent was confirmed.  The patient underwent general anaesthesia without difficulty.  The patient was positioned with arms tucked & secured appropriately.  VTE prevention in place.  The patient's abdomen was clipped,  prepped, & draped in a sterile fashion.  Surgical timeout confirmed our plan.  The  patient was positioned in reverse Trendelenburg.  Abdominal entry was gained using Varess technique at the left subcostal ridge on the anterior abdominal wall.  No elevated EtCO2 noted.  Port placed.  Camera inspection revealed no injury.  Extra ports were carefully placed under direct laparoscopic visualization.  We docked the [employer] Vinci robot carefully and placed intstruments under visualization  I mobilized & reflected the greater omentum in the upper abdomen.  Position the small bowel into the left lower quadrant and pelvis.  Freed off some moderate lesions of ileocecal region to the right lower quadrant to help straighten down and expose things better.  I was able to elevate the proximal colon to isolate the ileocolonic pedicle.  I scored the ileal mesentery just proximal to that.   I carried that further dissection in a medial to lateral fashion.  I was able to bluntly get into the retro-mesenteric plane on the right side.  I freed the proximal right sided colonic mesentery off the retroperitoneum including the duodenal sweep, pancreatic head, & Gerota's fascia of the right kidney. I was able to get underneath the hepatic flexure.  I was able to get underneath the proximal and mid transverse colon.    I isolated the proximal ileocecal pedicle.  I skeletonized it & transected the vessels.  I placed the proximal mid transverse colon and attention to find the dominant right middle colic pedicle.  Transected through the visceral peritoneum of the mesentery of that transverse colon in a radial fashion to preserve that pedicle, just staying proximal to it.  I was able to get through the distal transverse colon mesentery elevated.  Therefore resected the remaining pedicle of the right colic vessels and hepatic flexure and proximal transverse colon vessels off the retroperitoneum to get good high ligation.  I freed the greater omentum off the mid transverse colon towards the hepatic flexure.    I then  proceeded to mobilize the terminal ileum & proximal right colon in a lateral to medial fashion.  I mobilized the distal ileal mesentery off its retroperitoneal and pelvic attachments.  I mobilized the ascending colon off It is side wall attachments to the paracolic gutter and retroperitoneum.  I also mobilized the mid to proximal transverse colon off the retroperitoneum in a superior to inferior fashion.  This allowed me to mobilize the hepatic flexure and get a complete mobilization of the proximal right colon to the mid-transverse colon.   I could isolate the pathology. Chose an area in the distal ileum and transected the mesentery radially chose an area in the transverse colon just proximal to a dominant middle colic artery pedicle and transected that in a radial fashion to good location.    To assess vascular perfusion of tissues, we asked anesthesia use intravenous  indocyanine green (ICG) with IV flush.  I switched to the NIR fluorescence (Firefly mode) imaging window on the daVinci robot platform.  We were able to see good light green visualization of blood vessels with good vascular perfusion of tissues, confirming good tissue perfusion of tissues (ileum and transverse colon) to heve healthy ends planned for anastomosis.   When he went ahead and proceeded with transection.  We transected at the distal ileum with a robotic stapler 60mm blue load.  We then transected transverse colon with a robotic stapler 60mm blue load.  We confirmed hemostasis.  I did a side-to-side stapled anastomosis of  ileum to mid-transverse colon using a 60mm white load in an isoperistaltic fashion.  (Distal stump of ileum to mid transverse colon for the distal end of the anastomosis.  Proximal end of colon stump to more proximal ileum for the proximal end of the anastomosis).  I sewed the common staple channel wound with an absorbable suture (V-lock) in a running [place] fashion from each corner and meeting in the center.  We  did meticulous inspection prove an airtight closure.  Using with the sutures to close the mesenteric defect between the distal ileum and transverse colon in a running fashion.  The anastomosis laid well without any twisting or torsion.  I protected the anastomosis line with an anterior omentopexy of greater omentum using V lock suture.    We did reinspection of the abdomen.  Hemostasis was good.  Ureters, retroperitoneum, and bowel uninjured.  The anastomosis looked healthy.   Endoluminal gas was evacuated.  We placed the wound protector through the suprapubic 12mm port site after it was enlarged in a Pfannenstiel fashion.  Specimen removed without incident.   Ports & wound protector removed.  Hemostasis was good.  Sterile unused instruments were used from this point.  I closed the skin at the port sites using Monocryl stitch and sterile dressing.  I closed the extraction wound using a 0 Vicryl vertical peritoneal closure and a #1 PDS transverse anterior rectal fascial closure like a small Pfannenstiel closure. I closed the skin with some interrupted Monocryl stitches.  I placed sterile dressings.     Patient is being extubated go to recovery room. I discussed postop care with the patient in detail the office & in the holding area. Instructions are written. I discussed operative findings, updated the patient's status, discussed probable steps to recovery, and gave postoperative recommendations to the patient's spouse, Makyla Bye.  Recommendations were made.  Questions were answered.  He expressed understanding & appreciation.  Elspeth KYM Schultze, M.D., F.A.C.S. Gastrointestinal and Minimally Invasive Surgery [facility] [address], [address] [place], [place] [address] [phone] Main / Paging

## 2024-06-20 NOTE — Discharge Instructions (Signed)
 SURGERY: POST OP INSTRUCTIONS (Surgery for small bowel obstruction, colon resection, etc)   ######################################################################  EAT Gradually transition to a high fiber diet with a fiber supplement over the next few days after discharge  WALK Walk an hour a day.  Control your pain to do that.    CONTROL PAIN Control pain so that you can walk, sleep, tolerate sneezing/coughing, go up/down stairs.  HAVE A BOWEL MOVEMENT DAILY Keep your bowels regular to avoid problems.  OK to try a laxative to override constipation.  OK to use an antidairrheal to slow down diarrhea.  Call if not better after 2 tries  CALL IF YOU HAVE PROBLEMS/CONCERNS Call if you are still struggling despite following these instructions. Call if you have concerns not answered by these instructions  ######################################################################   DIET Follow a light diet the first few days at home.  Start with a bland diet such as soups, liquids, starchy foods, low fat foods, etc.  If you feel full, bloated, or constipated, stay on a ful liquid or pureed/blenderized diet for a few days until you feel better and no longer constipated. Be sure to drink plenty of fluids every day to avoid getting dehydrated (feeling dizzy, not urinating, etc.). Gradually add a fiber supplement to your diet over the next week.  Gradually get back to a regular solid diet.  Avoid fast food or heavy meals the first week as you are more likely to get nauseated. It is expected for your digestive tract to need a few months to get back to normal.  It is common for your bowel movements and stools to be irregular.  You will have occasional bloating and cramping that should eventually fade away.  Until you are eating solid food normally, off all pain medications, and back to regular activities; your bowels will not be normal. Focus on eating a low-fat, high fiber diet the rest of your life  (See Getting to Good Bowel Health, below).  CARE of your INCISION or WOUND  It is good for closed incisions and even open wounds to be washed every day.  Shower every day.  Short baths are fine.  Wash the incisions and wounds clean with soap & water.    You may leave closed incisions open to air if it is dry.   You may cover the incision with clean gauze & replace it after your daily shower for comfort.  TEGADERM:  You have clear gauze band-aid dressings over your closed incision(s).  Remove the dressings 2 days after surgery = 12/14 Sunday.    If you have an open wound with a wound vac, see wound vac care instructions.    ACTIVITIES as tolerated Start light daily activities --- self-care, walking, climbing stairs-- beginning the day after surgery.  Gradually increase activities as tolerated.  Control your pain to be active.  Stop when you are tired.  Ideally, walk several times a day, eventually an hour a day.   Most people are back to most day-to-day activities in a few weeks.  It takes 4-8 weeks to get back to unrestricted, intense activity. If you can walk 30 minutes without difficulty, it is safe to try more intense activity such as jogging, treadmill, bicycling, low-impact aerobics, swimming, etc. Save the most intensive and strenuous activity for last (Usually 4-8 weeks after surgery) such as sit-ups, heavy lifting, contact sports, etc.  Refrain from any intense heavy lifting or straining until you are off narcotics for pain control.  You will have  off days, but things should improve week-by-week. DO NOT PUSH THROUGH PAIN.  Let pain be your guide: If it hurts to do something, don't do it.  Pain is your body warning you to avoid that activity for another week until the pain goes down. You may drive when you are no longer taking narcotic prescription pain medication, you can comfortably wear a seatbelt, and you can safely make sudden turns/stops to protect yourself without hesitating due to  pain. You may have sexual intercourse when it is comfortable. If it hurts to do something, stop.   MEDICATIONS Take your usually prescribed home medications unless otherwise directed.    Blood thinners:  You can restart any strong blood thinners after the second postoperative day  for example: COUMADIN (warfarin), XERELTO (rivaroxaban), ELIQUIS (apixaban), PLAVIX (clopidigrel), BRILINTA (ticagrelor), EFFIENT (prasugrel), PRADAXA (dabigatran), etc  Continue aspirin before & after surgery..     Some oozing/bleeding the first 1-2 weeks is common but should taper down & be small volume.    If you are passing many large clots or having uncontrolling bleeding, call your surgeon    PAIN CONTROL Pain after surgery or related to activity is often due to strain/injury to muscle, tendon, nerves and/or incisions.  This pain is usually short-term and will improve in a few months.  To help speed the process of healing and to get back to regular activity more quickly, DO THE FOLLOWING THINGS TOGETHER: Increase activity gradually.  DO NOT PUSH THROUGH PAIN Use Ice and/or Heat Try Gentle Massage and/or Stretching Take over the counter pain medication Take Narcotic prescription pain medication for more severe pain  Good pain control = faster recovery.  It is better to take more medicine to be more active than to stay in bed all day to avoid medications.  Increase activity gradually Avoid heavy lifting at first, then increase to lifting as tolerated over the next 6 weeks. Do not push through the pain.  Listen to your body and avoid positions and maneuvers than reproduce the pain.  Wait a few days before trying something more intense Walking an hour a day is encouraged to help your body recover faster and more safely.  Start slowly and stop when getting sore.  If you can walk 30 minutes without stopping or pain, you can try more intense activity (running, jogging, aerobics, cycling, swimming,  treadmill, sex, sports, weightlifting, etc.) Remember: If it hurts to do it, then dont do it! Use Ice and/or Heat You will have swelling and bruising around the incisions.  This will take several weeks to resolve. Ice packs or heating pads (6-8 times a day, 30-60 minutes at a time) will help sooth soreness & bruising. Some people prefer to use ice alone, heat alone, or alternate between ice & heat.  Experiment and see what works best for you.  Consider trying ice for the first few days to help decrease swelling and bruising; then, switch to heat to help relax sore spots and speed recovery. Shower every day.  Short baths are fine.  It feels good!  Keep the incisions and wounds clean with soap & water.   Try Gentle Massage and/or Stretching Massage at the area of pain many times a day Stop if you feel pain - do not overdo it Take over the counter pain medication This helps the muscle and nerve tissues become less irritable and calm down faster Choose ONE of the following over-the-counter anti-inflammatory medications: Acetaminophen  500mg  tabs (Tylenol ) 1-2 pills with every  meal and just before bedtime (avoid if you have liver problems or if you have acetaminophen  in you narcotic prescription) Naproxen  220mg  tabs (ex. Aleve , Naprosyn ) 1-2 pills twice a day (avoid if you have kidney, stomach, IBD, or bleeding problems) Ibuprofen  200mg  tabs (ex. Advil , Motrin ) 3-4 pills with every meal and just before bedtime (avoid if you have kidney, stomach, IBD, or bleeding problems) Take with food/snack several times a day as directed for at least 2 weeks to help keep pain / soreness down & more manageable. Take Narcotic prescription pain medication for more severe pain A prescription for strong pain control is often given to you upon discharge (for example: oxycodone /Percocet, hydrocodone/Norco/Vicodin, or tramadol /Ultram ) Take your pain medication as prescribed. Be mindful that most narcotic prescriptions  contain Tylenol  (acetaminophen ) as well - avoid taking too much Tylenol . If you are having problems/concerns with the prescription medicine (does not control pain, nausea, vomiting, rash, itching, etc.), please call us  [phone] to see if we need to switch you to a different pain medicine that will work better for you and/or control your side effects better. If you need a refill on your pain medication, you must call the office before 4 pm and on weekdays only.  By federal law, prescriptions for narcotics cannot be called into a pharmacy.  They must be filled out on paper & picked up from our office by the patient or authorized caretaker.  Prescriptions cannot be filled after 4 pm nor on weekends.    WHEN TO CALL US  [phone] Severe uncontrolled or worsening pain  Fever over 101 F (38.5 C) Concerns with the incision: Worsening pain, redness, rash/hives, swelling, bleeding, or drainage Reactions / problems with new medications (itching, rash, hives, nausea, etc.) Nausea and/or vomiting Difficulty urinating Difficulty breathing Worsening fatigue, dizziness, lightheadedness, blurred vision Other concerns If you are not getting better after two weeks or are noticing you are getting worse, contact our office [phone] for further advice.  We may need to adjust your medications, re-evaluate you in the office, send you to the emergency room, or see what other things we can do to help. The clinic staff is available to answer your questions during regular business hours (8:30am-5pm).  Please dont hesitate to call and ask to speak to one of our nurses for clinical concerns.    A surgeon from [facility] is always on call at the hospitals 24 hours/day If you have a medical emergency, go to the nearest emergency room or call 911.  FOLLOW UP in our office One the day of your discharge from the hospital (or the next business weekday), please call [facility] to set up  or confirm an appointment to see your surgeon in the office for a follow-up appointment.  Usually it is 2-3 weeks after your surgery.   If you have skin staples at your incision(s), let the office know so we can set up a time in the office for the nurse to remove them (usually around 10 days after surgery). Make sure that you call for appointments the day of discharge (or the next business weekday) from the hospital to ensure a convenient appointment time. IF YOU HAVE DISABILITY OR FAMILY LEAVE FORMS, BRING THEM TO THE OFFICE FOR PROCESSING.  DO NOT GIVE THEM TO YOUR DOCTOR.  [facility] [address], [place], [place]  [address] ? [phone] - Main [phone] - Toll Free,  [phone] - Fax [email or web address]  GETTING TO GOOD BOWEL HEALTH. It is expected for your digestive tract to need a few months to get back to normal.  It is common for your bowel movements and stools to be irregular.  You will have occasional bloating and cramping that should eventually fade away.  Until you are eating solid food normally, off all pain medications, and back to regular activities; your bowels will not be normal.   Avoiding constipation The goal: ONE SOFT BOWEL MOVEMENT A DAY!    Drink plenty of fluids.  Choose water first. TAKE A FIBER SUPPLEMENT EVERY DAY THE REST OF YOUR LIFE During your first week back home, gradually add back a fiber supplement every day Experiment which form you can tolerate.   There are many forms such as powders, tablets, wafers, gummies, etc Psyllium bran (Metamucil), methylcellulose (Citrucel), Miralax  or Glycolax , Benefiber, Flax Seed.  Adjust the dose week-by-week (1/2 dose/day to 6 doses a day) until you are moving your bowels 1-2 times a day.  Cut back the dose or try a different fiber product if it is giving you problems such as diarrhea or bloating. Sometimes a laxative is needed to help jump-start bowels if  constipated until the fiber supplement can help regulate your bowels.  If you are tolerating eating & you are farting, it is okay to try a gentle laxative such as double dose MiraLax , prune juice, or Milk of Magnesia.  Avoid using laxatives too often. Stool softeners can sometimes help counteract the constipating effects of narcotic pain medicines.  It can also cause diarrhea, so avoid using for too long. If you are still constipated despite taking fiber daily, eating solids, and a few doses of laxatives, call our office. Controlling diarrhea Try drinking liquids and eating bland foods for a few days to avoid stressing your intestines further. Avoid dairy products (especially milk & ice cream) for a short time.  The intestines often can lose the ability to digest lactose when stressed. Avoid foods that cause gassiness or bloating.  Typical foods include beans and other legumes, cabbage, broccoli, and dairy foods.  Avoid greasy, spicy, fast foods.  Every person has some sensitivity to other foods, so listen to your body and avoid those foods that trigger problems for you. Probiotics (such as active yogurt, Align, etc) may help repopulate the intestines and colon with normal bacteria and calm down a sensitive digestive tract Adding a fiber supplement gradually can help thicken stools by absorbing excess fluid and retrain the intestines to act more normally.  Slowly increase the dose over a few weeks.  Too much fiber too soon can backfire and cause cramping & bloating. It is okay to try and slow down diarrhea with a few doses of antidiarrheal medicines.   Bismuth subsalicylate (ex. Kayopectate, Pepto Bismol) for a few doses can help control diarrhea.  Avoid if pregnant.   Loperamide  (Imodium ) can slow down diarrhea.  Start with one tablet (2mg ) first.  Avoid if you are having fevers or severe pain.  ILEOSTOMY PATIENTS WILL HAVE CHRONIC DIARRHEA since their colon is not in use.    Drink plenty of liquids.   You will need to drink even more glasses of water/liquid a day to avoid getting dehydrated. Record output from your ileostomy.  Expect to empty the bag every 3-4 hours at first.  Most people with a permanent ileostomy empty their bag 4-6 times at the least.   Use antidiarrheal medicine (especially Imodium ) several times a day to avoid getting dehydrated.  Start with a dose at bedtime & breakfast.  Adjust up or down as needed.  Increase antidiarrheal medications as directed to avoid emptying the bag more than 8 times a day (every 3 hours). Work with your wound ostomy nurse to learn care for your ostomy.  See ostomy care instructions. TROUBLESHOOTING IRREGULAR BOWELS 1) Start with a soft & bland diet. No spicy, greasy, or fried foods.  2) Avoid gluten/wheat or dairy products from diet to see if symptoms improve. 3) Miralax  17gm or flax seed mixed in 8oz. water or juice-daily. May use 2-4 times a day as needed. 4) Gas-X, Phazyme, etc. as needed for gas & bloating.  5) Prilosec (omeprazole) over-the-counter as needed 6)  Consider probiotics (Align, Activa, etc) to help calm the bowels down  Call your doctor if you are getting worse or not getting better.  Sometimes further testing (cultures, endoscopy, X-ray studies, CT scans, bloodwork, etc.) may be needed to help diagnose and treat the cause of the diarrhea. [facility] [address], [address], [place], [place]  [address] [phone] - Main.    [phone]  - Toll Free.   [phone] - Fax [email or web address]   Managing Pain  ######################################################################   CONTROL PAIN Control pain so that you can walk, sleep, tolerate sneezing/coughing, go up/down stairs.  (Good pain control is not pain free only when lying still, unable to move)  WALK Walk an hour a day.  Control your pain to do that.   HAVE A BOWEL MOVEMENT DAILY Keep your bowels regular to avoid  problems.  OK to try a laxative to override constipation.  OK to use an antidairrheal to slow down diarrhea.  Call if not better after 2 tries  CALL IF YOU HAVE PROBLEMS/CONCERNS Call if you are still struggling despite following these instructions. Call if you have concerns not answered by these instructions  ######################################################################   Pain after surgery or related to activity is often due to strain/injury to muscle, tendon, nerves and/or incisions.  This pain is usually short-term and will improve in a few months.   Many people find it helpful to do the following things TOGETHER to help speed the process of healing and to get back to regular activity more quickly:  Avoid heavy physical activity at first No lifting greater than 20 pounds at first, then increase to lifting as tolerated over the next few weeks Do not push through the pain.  Listen to your body and avoid positions and maneuvers than reproduce the pain.  Wait a few days before trying something more intense Walking is okay as tolerated, but go slowly and stop when getting sore.  If you can walk 30 minutes without stopping or pain, you can try more intense activity (running, jogging, aerobics, cycling, swimming, treadmill, sex, sports, weightlifting, etc ) Remember: If it hurts to do it, then dont do it!  Take Acetaminophen  Anti-inflammatory medication Acetaminophen  500mg  tabs (Tylenol ) 1-2 pills with every meal and just before bedtime (avoid if you have liver problems) Take with food/snack around the clock for 1-2 weeks This helps the muscle and nerve tissues become less irritable and calm down faster  Use a Heating pad or Ice/Cold Pack 4-6 times a day May use warm bath/hottub  or showers  Try Gentle Massage and/or Stretching  at the area of pain many times a day stop if you feel pain - do not overdo it  Try these steps together to help you body heal faster and  avoid making  things get worse.  Doing just one of these things may not be enough.    If you are not getting better after two weeks or are noticing you are getting worse, contact our office for further advice; we may need to re-evaluate you & see what other things we can do to help.

## 2024-06-20 NOTE — Anesthesia Preprocedure Evaluation (Addendum)
 Anesthesia Evaluation  Patient identified by MRN, date of birth, ID band Patient awake    Reviewed: Allergy & Precautions, H&P , NPO status , Patient's Chart, lab work & pertinent test results  History of Anesthesia Complications (+) PONV and history of anesthetic complications  Airway Mallampati: I  TM Distance: >3 FB Neck ROM: Full    Dental no notable dental hx. (+) Teeth Intact, Dental Advisory Given, Caps   Pulmonary neg pulmonary ROS   Pulmonary exam normal breath sounds clear to auscultation       Cardiovascular Exercise Tolerance: Good hypertension, negative cardio ROS Normal cardiovascular exam Rhythm:Regular Rate:Normal     Neuro/Psych negative neurological ROS  negative psych ROS   GI/Hepatic negative GI ROS, Neg liver ROS,,,  Endo/Other  negative endocrine ROS    Renal/GU negative Renal ROS  negative genitourinary   Musculoskeletal negative musculoskeletal ROS (+) Arthritis , Osteoarthritis,    Abdominal   Peds negative pediatric ROS (+)  Hematology negative hematology ROS (+)   Anesthesia Other Findings   Reproductive/Obstetrics negative OB ROS                              Anesthesia Physical Anesthesia Plan  ASA: 2  Anesthesia Plan: General   Post-op Pain Management: Ofirmev  IV (intra-op)* and Ketamine IV*   Induction: Intravenous  PONV Risk Score and Plan: 4 or greater and Ondansetron , Dexamethasone, Propofol infusion and Treatment may vary due to age or medical condition  Airway Management Planned: Oral ETT  Additional Equipment: None  Intra-op Plan:   Post-operative Plan: Extubation in OR  Informed Consent: I have reviewed the patients History and Physical, chart, labs and discussed the procedure including the risks, benefits and alternatives for the proposed anesthesia with the patient or authorized representative who has indicated his/her understanding  and acceptance.       Plan Discussed with: Anesthesiologist and CRNA  Anesthesia Plan Comments: (  )         Anesthesia Quick Evaluation

## 2024-06-20 NOTE — Transfer of Care (Signed)
 Immediate Anesthesia Transfer of Care Note  Patient: Katie Tanner  Procedure(s) Performed: Procedures with comments: COLECTOMY, RIGHT, ROBOT-ASSISTED (N/A) - ROBOTIC COLECTOMY PROXIMAL  Patient Location: PACU  Anesthesia Type:General  Level of Consciousness:  sedated, patient cooperative and responds to stimulation  Airway & Oxygen Therapy:Patient Spontanous Breathing and Patient connected to face mask oxgen  Post-op Assessment:  Report given to PACU RN and Post -op Vital signs reviewed and stable  Post vital signs:  Reviewed and stable  Last Vitals:  Vitals:   06/20/24 1152  BP: (!) 173/85  Pulse: 96  Resp: 16  Temp: (!) 36.4 C  SpO2: 96%    Complications: No apparent anesthesia complications

## 2024-06-20 NOTE — Anesthesia Procedure Notes (Signed)
 Procedure Name: Intubation Date/Time: 06/20/2024 4:59 PM  Performed by: Vincenzo Show, CRNAPre-anesthesia Checklist: Patient identified, Emergency Drugs available, Suction available, Patient being monitored and Timeout performed Patient Re-evaluated:Patient Re-evaluated prior to induction Oxygen Delivery Method: Circle system utilized Preoxygenation: Pre-oxygenation with 100% oxygen Induction Type: IV induction Ventilation: Mask ventilation without difficulty Laryngoscope Size: Mac and 3 Grade View: Grade I Tube type: Oral Tube size: 7.0 mm Number of attempts: 1 Airway Equipment and Method: Stylet Placement Confirmation: ETT inserted through vocal cords under direct vision, positive ETCO2, CO2 detector and breath sounds checked- equal and bilateral Secured at: 22 cm Tube secured with: Tape Dental Injury: Teeth and Oropharynx as per pre-operative assessment  Comments: ATOI

## 2024-06-21 LAB — CBC
HCT: 33.7 % — ABNORMAL LOW (ref 36.0–46.0)
Hemoglobin: 11.5 g/dL — ABNORMAL LOW (ref 12.0–15.0)
MCH: 28.5 pg (ref 26.0–34.0)
MCHC: 34.1 g/dL (ref 30.0–36.0)
MCV: 83.6 fL (ref 80.0–100.0)
Platelets: 162 K/uL (ref 150–400)
RBC: 4.03 MIL/uL (ref 3.87–5.11)
RDW: 13.2 % (ref 11.5–15.5)
WBC: 9.6 K/uL (ref 4.0–10.5)
nRBC: 0 % (ref 0.0–0.2)

## 2024-06-21 LAB — BASIC METABOLIC PANEL WITH GFR
Anion gap: 11 (ref 5–15)
BUN: 13 mg/dL (ref 8–23)
CO2: 26 mmol/L (ref 22–32)
Calcium: 9.4 mg/dL (ref 8.9–10.3)
Chloride: 103 mmol/L (ref 98–111)
Creatinine, Ser: 0.93 mg/dL (ref 0.44–1.00)
GFR, Estimated: >60 mL/min (ref >60)
Glucose, Bld: 122 mg/dL — ABNORMAL HIGH (ref 70–99)
Potassium: 3.6 mmol/L (ref 3.5–5.1)
Sodium: 139 mmol/L (ref 135–145)

## 2024-06-21 LAB — MAGNESIUM: Magnesium: 2 mg/dL (ref 1.7–2.4)

## 2024-06-21 MED ADMIN — Calcium Polycarbophil Tab 625 MG: 625 mg | ORAL | NDC 00904250091

## 2024-06-21 MED ADMIN — Sodium Chloride Flush IV Soln 0.9%: 3 mL | INTRAVENOUS | NDC 8881579123

## 2024-06-21 NOTE — Plan of Care (Signed)
  Problem: Education: Goal: Knowledge of General Education information will improve Description: Including pain rating scale, medication(s)/side effects and non-pharmacologic comfort measures Outcome: Progressing   Problem: Pain Managment: Goal: General experience of comfort will improve and/or be controlled Outcome: Progressing

## 2024-06-21 NOTE — Anesthesia Postprocedure Evaluation (Signed)
 Anesthesia Post Note  Patient: Katie Tanner  Procedure(s) Performed: COLECTOMY, RIGHT, ROBOT-ASSISTED (Abdomen)     Patient location during evaluation: PACU Anesthesia Type: General Level of consciousness: awake and alert Pain management: pain level controlled Vital Signs Assessment: post-procedure vital signs reviewed and stable Respiratory status: spontaneous breathing, nonlabored ventilation, respiratory function stable and patient connected to nasal cannula oxygen Cardiovascular status: blood pressure returned to baseline and stable Postop Assessment: no apparent nausea or vomiting Anesthetic complications: no   No notable events documented.  Last Vitals:  Vitals:   06/21/24 0130 06/21/24 0517  BP: 118/71 133/69  Pulse: 85 77  Resp: 18 16  Temp: 36.6 C 36.7 C  SpO2: 98% 95%    Last Pain:  Vitals:   06/21/24 0800  TempSrc:   PainSc: 0-No pain                 Nakeisha Greenhouse

## 2024-06-21 NOTE — Progress Notes (Signed)
 1 Day Post-Op robotic R colectomy Subjective: Feeling a bit sore this am.  Tolerating liquids  Objective: Vital signs in last 24 hours: Temp:  [97.4 F (36.3 C)-98.8 F (37.1 C)] 98.3 F (36.8 C) (12/13 0928) Pulse Rate:  [69-96] 91 (12/13 0928) Resp:  [13-19] 18 (12/13 0928) BP: (118-173)/(64-96) 142/71 (12/13 0928) SpO2:  [95 %-100 %] 97 % (12/13 0928) Weight:  [66 kg-70.7 kg] 70.7 kg (12/13 0518)   Intake/Output from previous day: 12/12 0701 - 12/13 0700 In: 2897.2 [P.O.:210; I.V.:1700.6; IV Piggyback:986.6] Out: 525 [Urine:425; Blood:100] Intake/Output this shift: Total I/O In: 240 [P.O.:240] Out: 50 [Urine:50]   General appearance: alert and cooperative GI: soft, non-distended  Incision: no significant drainage  Lab Results:  Recent Labs    06/21/24 0507  WBC 9.6  HGB 11.5*  HCT 33.7*  PLT 162   BMET Recent Labs    06/21/24 0507  NA 139  K 3.6  CL 103  CO2 26  GLUCOSE 122*  BUN 13  CREATININE 0.93  CALCIUM  9.4   PT/INR No results for input(s): LABPROT, INR in the last 72 hours. ABG No results for input(s): PHART, HCO3 in the last 72 hours.  Invalid input(s): PCO2, PO2  MEDS, Scheduled  acetaminophen   1,000 mg Oral Q6H   alvimopan   12 mg Oral BID   enoxaparin  (LOVENOX ) injection  40 mg Subcutaneous Q24H   feeding supplement  237 mL Oral BID BM   gabapentin   200 mg Oral QHS   hydrochlorothiazide   12.5 mg Oral Daily   lisinopril   10 mg Oral Daily   polycarbophil  625 mg Oral BID   sodium chloride  flush  3 mL Intravenous Q12H    Studies/Results: No results found.  Assessment: s/p Procedures: COLECTOMY, RIGHT, ROBOT-ASSISTED Patient Active Problem List   Diagnosis Date Noted   Hypertensive disorder 05/21/2024   Osteopenia 05/21/2024   Psoriatic arthritis (HCC) 05/21/2024   Osteoarthritis of left knee 05/21/2024   Mass of cecum 05/21/2024   History of colonic polyps 05/21/2024    Expected post op course  Plan: d/c  foley Pt prefers to cont liquid diet for now Ambulate in hall   LOS: 1 day     .Bernarda JAYSON Ned, MD [facility], [place]    06/21/2024 9:55 AM

## 2024-06-22 ENCOUNTER — Encounter (HOSPITAL_COMMUNITY): Payer: Self-pay | Admitting: Surgery

## 2024-06-22 MED ORDER — LOPERAMIDE HCL 2 MG PO CAPS
2.0000 mg | ORAL_CAPSULE | Freq: Three times a day (TID) | ORAL | Status: DC
  Administered 2024-06-22 – 2024-06-24 (×7): 2 mg via ORAL
  Filled 2024-06-22 (×7): qty 1

## 2024-06-22 MED ADMIN — Sodium Chloride Flush IV Soln 0.9%: 3 mL | INTRAVENOUS | NDC 8881579123

## 2024-06-22 MED ADMIN — Calcium Polycarbophil Tab 625 MG: 625 mg | ORAL | NDC 00904250091

## 2024-06-22 NOTE — Plan of Care (Signed)
  Problem: Skin Integrity: Goal: Will show signs of wound healing Outcome: Progressing   

## 2024-06-22 NOTE — Progress Notes (Signed)
 2 Days Post-Op robotic R colectomy Subjective: Tolerating liquids but having a lot of diarrhea, denies nausea  Objective: Vital signs in last 24 hours: Temp:  [97.7 F (36.5 C)-98.3 F (36.8 C)] 97.7 F (36.5 C) (12/14 0536) Pulse Rate:  [81-104] 83 (12/14 0536) Resp:  [17-18] 17 (12/14 0536) BP: (112-142)/(59-71) 116/59 (12/14 0536) SpO2:  [94 %-97 %] 95 % (12/14 0536) Weight:  [71 kg] 71 kg (12/14 0536)   Intake/Output from previous day: 12/13 0701 - 12/14 0700 In: 1603 [P.O.:1600; I.V.:3] Out: 150 [Urine:150] Intake/Output this shift: No intake/output data recorded.   General appearance: alert and cooperative GI: soft, non-distended  Incision: no significant drainage  Lab Results:  Recent Labs    06/21/24 0507  WBC 9.6  HGB 11.5*  HCT 33.7*  PLT 162   BMET Recent Labs    06/21/24 0507  NA 139  K 3.6  CL 103  CO2 26  GLUCOSE 122*  BUN 13  CREATININE 0.93  CALCIUM  9.4   PT/INR No results for input(s): LABPROT, INR in the last 72 hours. ABG No results for input(s): PHART, HCO3 in the last 72 hours.  Invalid input(s): PCO2, PO2  MEDS, Scheduled  acetaminophen   1,000 mg Oral Q6H   alvimopan   12 mg Oral BID   enoxaparin  (LOVENOX ) injection  40 mg Subcutaneous Q24H   feeding supplement  237 mL Oral BID BM   gabapentin   200 mg Oral QHS   hydrochlorothiazide   12.5 mg Oral Daily   lisinopril   10 mg Oral Daily   loperamide   2 mg Oral TID AC   polycarbophil  625 mg Oral BID   sodium chloride  flush  3 mL Intravenous Q12H    Studies/Results: No results found.  Assessment: s/p Procedures: COLECTOMY, RIGHT, ROBOT-ASSISTED Patient Active Problem List   Diagnosis Date Noted   Hypertensive disorder 05/21/2024   Osteopenia 05/21/2024   Psoriatic arthritis (HCC) 05/21/2024   Osteoarthritis of left knee 05/21/2024   Mass of cecum 05/21/2024   History of colonic polyps 05/21/2024    Expected post op course  Plan: Advance to solid  foods Will add scheduled imodium  for diarrhea Ambulate in hall   LOS: 2 days     .Katie JAYSON Ned, MD [facility], [place]    06/22/2024 8:09 AM

## 2024-06-23 ENCOUNTER — Other Ambulatory Visit (HOSPITAL_COMMUNITY): Payer: Self-pay

## 2024-06-23 MED ORDER — TRAMADOL HCL 50 MG PO TABS
50.0000 mg | ORAL_TABLET | Freq: Once | ORAL | Status: AC
  Administered 2024-06-23: 09:00:00 50 mg via ORAL
  Filled 2024-06-23: qty 1

## 2024-06-23 MED ORDER — CYCLOBENZAPRINE HCL 5 MG PO TABS
5.0000 mg | ORAL_TABLET | Freq: Three times a day (TID) | ORAL | Status: DC
  Administered 2024-06-23 – 2024-06-24 (×4): 5 mg via ORAL
  Filled 2024-06-23 (×4): qty 1

## 2024-06-23 MED ORDER — CYCLOBENZAPRINE HCL 5 MG PO TABS
5.0000 mg | ORAL_TABLET | Freq: Three times a day (TID) | ORAL | 1 refills | Status: DC
  Filled 2024-06-23: qty 20, 7d supply, fill #0

## 2024-06-23 MED ADMIN — Calcium Polycarbophil Tab 625 MG: 625 mg | ORAL | @ 09:00:00 | NDC 00904250091

## 2024-06-23 MED ADMIN — Calcium Polycarbophil Tab 625 MG: 625 mg | ORAL | @ 21:00:00 | NDC 00904250091

## 2024-06-23 MED ADMIN — Sodium Chloride Flush IV Soln 0.9%: 3 mL | INTRAVENOUS | @ 09:00:00 | NDC 8881579123

## 2024-06-23 MED ADMIN — Sodium Chloride Flush IV Soln 0.9%: 3 mL | INTRAVENOUS | @ 21:00:00 | NDC 8881579123

## 2024-06-23 NOTE — Progress Notes (Signed)
°   06/23/24 0838  TOC Brief Assessment  Insurance and Status Reviewed  Patient has primary care physician Yes  Home environment has been reviewed single family home  Prior level of function: independent  Prior/Current Home Services No current home services  Social Drivers of Health Review SDOH reviewed no interventions necessary  Readmission risk has been reviewed Yes  Transition of care needs no transition of care needs at this time    Signed: Heather Saltness, MSW, LCSW Clinical Social Worker Inpatient Care Management 06/23/2024 8:39 AM

## 2024-06-23 NOTE — Evaluation (Signed)
 Physical Therapy Evaluation Patient Details Name: Katie Tanner MRN: [id number] DOB: 11/03/1950 Today's Date: 06/23/2024  History of Present Illness  Pt is 73 yo female admitted on 06/20/24 with cecal mass and is s/p R colectomy.  Pt with hx including but not limited to HTN, osteopenia, psoriatic arthritis, L knee OA  Clinical Impression  Pt admitted with above diagnosis. At baseline, pt is independent.  She has RW at home, support from family, and can stay on first floor.  Today, pt transferring and ambulating at supervision level.  She is moving slower than baseline but expected to progress without skilled PT need.  Did provide education on mobility techniques and precautions.  No further PT needs.       If plan is discharge home, recommend the following: Assistance with cooking/housework;Help with stairs or ramp for entrance   Can travel by private vehicle        Equipment Recommendations None recommended by PT  Recommendations for Other Services       Functional Status Assessment Patient has had a recent decline in their functional status and demonstrates the ability to make significant improvements in function in a reasonable and predictable amount of time.     Precautions / Restrictions Precautions Precautions: Fall      Mobility  Bed Mobility Overal bed mobility: Needs Assistance Bed Mobility: Rolling, Sidelying to Sit Rolling: Used rails, Supervision Sidelying to sit: Contact guard assist, HOB elevated, Used rails       General bed mobility comments: cues for log roll, icnreased time    Transfers Overall transfer level: Needs assistance Equipment used: Rolling walker (2 wheels) Transfers: Sit to/from Stand Sit to Stand: Supervision                Ambulation/Gait Ambulation/Gait assistance: Supervision Gait Distance (Feet): 200 Feet Assistive device: Rolling walker (2 wheels) Gait Pattern/deviations: Step-through pattern       General Gait  Details: decreased speed but functional; steady balance  Stairs            Wheelchair Mobility     Tilt Bed    Modified Rankin (Stroke Patients Only)       Balance Overall balance assessment: Needs assistance Sitting-balance support: No upper extremity supported Sitting balance-Leahy Scale: Good     Standing balance support: No upper extremity supported Standing balance-Leahy Scale: Fair                               Pertinent Vitals/Pain Pain Assessment Pain Assessment: 0-10 Pain Score: 2  (2/10 when still; 4 with mobility) Pain Location: abdomen Pain Descriptors / Indicators: Discomfort Pain Intervention(s): Limited activity within patient's tolerance, Monitored during session, Premedicated before session, Repositioned    Home Living Family/patient expects to be discharged to:: Private residence Living Arrangements: Spouse/significant other Available Help at Discharge: Family;Available 24 hours/day Type of Home: House Home Access: Stairs to enter Entrance Stairs-Rails: Doctor, General Practice of Steps: 4   Home Layout: One level;Able to live on main level with bedroom/bathroom Home Equipment: Grab bars - tub/shower;Shower Counsellor (2 wheels)      Prior Function Prior Level of Function : Independent/Modified Independent;Driving             Mobility Comments: ambulatory in community without AD       Extremity/Trunk Assessment   Upper Extremity Assessment Upper Extremity Assessment: Overall WFL for tasks assessed    Lower Extremity Assessment Lower Extremity Assessment:  Overall [facility] for tasks assessed    Cervical / Trunk Assessment Cervical / Trunk Assessment: Other exceptions Cervical / Trunk Exceptions: abdominal sx  Communication        Cognition Arousal: Alert Behavior During Therapy: WFL for tasks assessed/performed   PT - Cognitive impairments: No apparent impairments                                  Cueing       General Comments General comments (skin integrity, edema, etc.): VSS.  Educated on positions of comfort, log rolls, hugging pillow when coughing/laughing/sneezing/etc, car transfers, frequent short bouts of activity, gradually increasing endurance, and no heavy lifting.     Exercises     Assessment/Plan    PT Assessment Patient does not need any further PT services  PT Problem List         PT Treatment Interventions      PT Goals (Current goals can be found in the Care Plan section)  Acute Rehab PT Goals Patient Stated Goal: return home PT Goal Formulation: All assessment and education complete, DC therapy    Frequency       Co-evaluation               AM-PAC PT 6 Clicks Mobility  Outcome Measure Help needed turning from your back to your side while in a flat bed without using bedrails?: None Help needed moving from lying on your back to sitting on the side of a flat bed without using bedrails?: A Little Help needed moving to and from a bed to a chair (including a wheelchair)?: A Little Help needed standing up from a chair using your arms (e.g., wheelchair or bedside chair)?: A Little Help needed to walk in hospital room?: A Little Help needed climbing 3-5 steps with a railing? : A Little 6 Click Score: 19    End of Session   Activity Tolerance: Patient tolerated treatment well Patient left: in chair;with call bell/phone within reach (has been ambulating with family; knows to call -did not place alarm) Nurse Communication: Mobility status PT Visit Diagnosis: Other abnormalities of gait and mobility (R26.89)    Time: 8275-8256 PT Time Calculation (min) (ACUTE ONLY): 19 min   Charges:   PT Evaluation $PT Eval Low Complexity: 1 Low   PT General Charges $$ ACUTE PT VISIT: 1 Visit         Benjiman, PT Acute Rehab Services [facility] [phone]   Benjiman VEAR Mulberry 06/23/2024, 5:56 PM

## 2024-06-23 NOTE — Progress Notes (Signed)
 Mobility Specialist - Progress Note   06/23/24 0941  Mobility  Activity Ambulated with assistance  Level of Assistance Contact guard assist, steadying assist  Assistive Device Front wheel walker  Distance Ambulated (ft) 70 ft  Range of Motion/Exercises Active  Activity Response Tolerated fair  Mobility visit 1 Mobility  Mobility Specialist Start Time (ACUTE ONLY) [id number]  Mobility Specialist Stop Time (ACUTE ONLY) 0941  Mobility Specialist Time Calculation (min) (ACUTE ONLY) 13 min   Pt was found in bed and agreeable to mobilize. C/o abdominal pain and stated feeling heavy. At EOS returned to recliner chair with all needs met. Call bell in reach. RN notified.   Erminio Leos,  Mobility Specialist Can be reached via Secure Chat

## 2024-06-23 NOTE — Progress Notes (Signed)
 Chaplains received a consult to provide support to Katie Tanner after her surgery.  When I arrived, Katie Tanner was in good spirits and anticipating discharge tomorrow.  She expressed no needs at this time.

## 2024-06-23 NOTE — Progress Notes (Signed)
 06/23/2024  Katie Tanner [id number] 03-04-1951  CARE TEAM: PCP: Pridgen, Taylar, NP  Outpatient Care Team: Patient Care Team: Pridgen, Taylar, NP as PCP - General (Family Medicine) Livingston Rigg, MD as Consulting Physician (Dermatology) Sheldon Standing, MD as Consulting Physician (General Surgery) Dianna Specking, MD as Consulting Physician (Gastroenterology) Marvene Prentice SAUNDERS, FNP (Family Medicine)  Inpatient Treatment Team: Treatment Team:  Sheldon Standing, MD Noella Hands, RN Joannie Leader, NT Dehart, Donzell HERO, NT Beatrice Cathlean HERO, RN Osie Iantha SQUIBB, RPH Ramirez-Flores, Katie Saltness, Heather LABOR, [place]   Problem List:   Principal Problem:   Mass of cecum Active Problems:   Hypertensive disorder   Psoriatic arthritis (HCC)   06/20/2024  POST-OPERATIVE DIAGNOSIS:  CECAL MASS   PROCEDURE:   -ROBOTIC PROXIMAL RIGHT COLECTOMY WITH ANASTOMOSIS -INTRAOPERATIVE ASSESSMENT OF TISSUE VASCULAR PERFUSION USING ICG (indocyanine green) IMMUNOFLUORESCENCE -TRANSVERSUS ABDOMINIS PLANE (TAP) BLOCK - BILATERAL   SURGEON:  Standing KYM Sheldon, MD  OR FINDINGS:   Patient had some moderate adhesions in the lower abdomen.  A soft mass on the mesenteric side of the cecum near the ileocecal valve. No obvious metastatic disease on visceral parietal peritoneum or liver.  It is an isoperistaltic ileocolonic anastomosis that rests in the periumbilical region.  Assessment [facility] Stay = 3 days) 3 Days Post-Op    Struggling    Plan:  -ERAS  -Struggling with pain - I strongly recommended she ask for pain meds & suffer less.  Cont tylenol .  Make flexeril  scheduled.  Cont gabapentin .  Will order tramadol  now & see if that helps.  IV back up  -monitor electrolytes & replace as needed  Keep K>4, Mg>2, Phos>3  -VTE prophylaxis- SCDs.  Anticoagulation prophyllaxis SQ as appropriate  -mobilize as tolerated to help recovery.  Enlist therapies in moderate/high risk patients as  appropriate  I updated the patient's status to the patient and nurse  Patient tearful & worried.  I stressed that she deserves better pain control & to ask for help here & especially at home.  RN agrees.  Will see if TOC/SW & chaplain can help offer support as well.  Recommendations were made.  Questions were answered.  They expressed understanding & appreciation.  -Disposition:  Disposition:  The patient is from: Home Anticipate discharge to:  Home with Home Health Anticipated Date of Discharge is:  December 16,2025   Barriers to discharge:  Pending Clinical improvement (more likely than not), Therapy assessment & Recommendations pending, and Transitions of Care  Patient currently is close to be for discharge from the hospital from a surgery standpoint.      I reviewed nursing notes, last 24 h vitals and pain scores, last 48 h intake and output, last 24 h labs and trends, and last 24 h imaging results.  I have reviewed this patient's available data, including medical history, events of note, test results, etc as part of my evaluation.   A significant portion of that time was spent in counseling. Care during the described time interval was provided by me.  This care required moderate level of medical decision making.  06/23/2024    Subjective: (Chief complaint)  Sore.  Only using tylenol  Diarrhea less Worried   Objective:  Vital signs:  Vitals:   06/22/24 1442 06/22/24 1740 06/22/24 2018 06/23/24 0534  BP: (!) 97/53 (!) 96/55 (!) 98/55 104/60  Pulse:  77 78 80  Resp:  16 18 18   Temp:  98.4 F (36.9 C) 98.3 F (36.8 C) 97.9 F (36.6 C)  TempSrc:  Oral Oral Oral  SpO2:  96% 96% 95%  Weight:      Height:        Last BM Date : 06/23/24  Intake/Output   Yesterday:  12/14 0701 - 12/15 0700 In: 990 [P.O.:990] Out: 0  This shift:  No intake/output data recorded.  Bowel function:  Flatus: YES  BM:  YES  Drain: (No drain)   Physical Exam:  General: Pt  awake/alert in no acute distress but uncomfortable Eyes: PERRL, normal EOM.  Sclera clear.  No icterus Neuro: CN II-XII intact w/o focal sensory/motor deficits. Lymph: No head/neck/groin lymphadenopathy Psych:  No delerium/psychosis/paranoia.  Oriented x 4 HENT: Normocephalic, Mucus membranes moist.  No thrush Neck: Supple, No tracheal deviation.  No obvious thyromegaly Chest: No pain to chest wall compression.  Good respiratory excursion.  No audible wheezing CV:  Pulses intact.  Regular rhythm.  No major extremity edema MS: Normal AROM mjr joints.  No obvious deformity  Abdomen: Soft.  Nondistended.  Mildly tender at incisions only.  No evidence of peritonitis.  No incarcerated hernias.  Ext:  No deformity.  No mjr edema.  No cyanosis Skin: No petechiae / purpurea.  No major sores.  Warm and dry    Results:   Cultures: No results found for this or any previous visit (from the past 720 hours).  Labs: No results found for this or any previous visit (from the past 48 hours).  Imaging / Studies: No results found.  Medications / Allergies: per chart  Antibiotics: Anti-infectives (From admission, onward)    Start     Dose/Rate Route Frequency Ordered Stop   06/21/24 0400  cefoTEtan  (CEFOTAN ) 2 g in sodium chloride  0.9 % 100 mL IVPB        2 g 200 mL/hr over 30 Minutes Intravenous Every 12 hours 06/20/24 2045 06/21/24 0414   06/20/24 1400  neomycin  (MYCIFRADIN ) tablet 1,000 mg  Status:  Discontinued       Placed in And Linked Group   1,000 mg Oral 3 times per day 06/20/24 1138 06/20/24 2031   06/20/24 1400  metroNIDAZOLE  (FLAGYL ) tablet 1,000 mg  Status:  Discontinued       Placed in And Linked Group   1,000 mg Oral 3 times per day 06/20/24 1138 06/20/24 2031   06/20/24 1145  cefoTEtan  (CEFOTAN ) 2 g in sodium chloride  0.9 % 100 mL IVPB        2 g 200 mL/hr over 30 Minutes Intravenous On call to O.R. 06/20/24 1138 06/20/24 1628         Note: Portions of this report  may have been transcribed using voice recognition software. Every effort was made to ensure accuracy; however, inadvertent computerized transcription errors may be present.   Any transcriptional errors that result from this process are unintentional.    Elspeth KYM Schultze, MD, FACS, MASCRS Esophageal, Gastrointestinal & Colorectal Surgery Robotic and Minimally Invasive Surgery  [facility] [address] [place], [place] [address] [phone] Fax [phone] Main  CONTACT INFORMATION: Weekday (9AM-5PM): Call [facility] main office at [phone] Weeknight (5PM-9AM) or Weekend/Holiday: Check EPIC Web Links tab & use AMION (password  [email or web address]) for General Surgery [facility] coverage  Please, DO NOT use SecureChat  (it is not reliable communication to reach operating surgeons & will lead to a delay in care).   Epic staff messaging available for outpatient concerns needing 1-2 business day response.  06/23/2024  8:18 AM

## 2024-06-23 NOTE — Plan of Care (Signed)
   Problem: Education: Goal: Understanding of discharge needs will improve Outcome: Progressing   Problem: Activity: Goal: Ability to tolerate increased activity will improve Outcome: Progressing

## 2024-06-24 ENCOUNTER — Other Ambulatory Visit: Payer: Self-pay

## 2024-06-24 ENCOUNTER — Other Ambulatory Visit (HOSPITAL_COMMUNITY): Payer: Self-pay

## 2024-06-24 MED ADMIN — Calcium Polycarbophil Tab 625 MG: 625 mg | ORAL | @ 09:00:00 | NDC 00904250091

## 2024-06-24 NOTE — Discharge Summary (Signed)
 Physician Discharge Summary    Katie Tanner MRN: [id number] DOB/AGE: Jun 21, 1951 = 73 y.o.  Patient Care Team: Pridgen, Taylar, NP as PCP - General (Family Medicine) Livingston Rigg, MD as Consulting Physician (Dermatology) Sheldon Standing, MD as Consulting Physician (General Surgery) Dianna Specking, MD as Consulting Physician (Gastroenterology) Marvene Prentice SAUNDERS, FNP (Family Medicine)  Admit date: 06/20/2024  Discharge date: 06/24/2024  Hospital Stay = 4 days    Discharge Diagnoses:  Principal Problem:   Mass of cecum Active Problems:   Hypertensive disorder   Psoriatic arthritis (HCC)   4 Days Post-Op  06/20/2024  POST-OPERATIVE DIAGNOSIS:  CECAL MASS   PROCEDURE:   -ROBOTIC PROXIMAL RIGHT COLECTOMY WITH ANASTOMOSIS -INTRAOPERATIVE ASSESSMENT OF TISSUE VASCULAR PERFUSION USING ICG (indocyanine green) IMMUNOFLUORESCENCE -TRANSVERSUS ABDOMINIS PLANE (TAP) BLOCK - BILATERAL   SURGEON:  Standing KYM Sheldon, MD   OR FINDINGS:   Patient had some moderate adhesions in the lower abdomen.  A soft mass on the mesenteric side of the cecum near the ileocecal valve. No obvious metastatic disease on visceral parietal peritoneum or liver.  It is an isoperistaltic ileocolonic anastomosis that rests in the periumbilical region.    Consults: Case Management / Social Work, Physical Therapy, Pharmacy, Nutrition, and Anesthesia  Hospital Course:   The patient underwent the surgery above.  Postoperatively, the patient gradually mobilized and advanced to a solid diet.  Pain control adjusted and other symptoms were treated aggressively.    By the time of discharge, the patient was walking well the hallways, eating food, having flatus.  Pain was well-controlled on an oral medications.  Based on meeting discharge criteria and continuing to recover, I felt it was safe for the patient to be discharged from the hospital to further recover with close followup. Postoperative recommendations were  discussed in detail.  They are written as well.  Discharged Condition: good  Discharge Exam: Blood pressure (!) 123/59, pulse 80, temperature 98.2 F (36.8 C), temperature source Oral, resp. rate 16, height 5' 4 (1.626 m), weight 72 kg, SpO2 93%.  General: Pt awake/alert/oriented x4 in No acute distress.  Tearful but smiling Eyes: PERRL, normal EOM.  Sclera clear.  No icterus Neuro: CN II-XII intact w/o focal sensory/motor deficits. Lymph: No head/neck/groin lymphadenopathy Psych:  No delerium/psychosis/paranoia HENT: Normocephalic, Mucus membranes moist.  No thrush Neck: Supple, No tracheal deviation Chest:  No chest wall pain w good excursion CV:  Pulses intact.  Regular rhythm MS: Normal AROM mjr joints.  No obvious deformity Abdomen: Soft.  Nondistended.  Mildly tender at incisions only.  No evidence of peritonitis.  No incarcerated hernias. Ext:  SCDs BLE.  No mjr edema.  No cyanosis Skin: No petechiae / purpura   Disposition:    Follow-up Information     Sheldon Standing, MD Follow up on 07/14/2024.   Specialties: General Surgery, Colon and Rectal Surgery Why: To follow up after your operation Contact information: [address] [place] [address] [phone]                 Discharge disposition: 01-Home or Self Care       Discharge Instructions     Call MD for:   Complete by: As directed    FEVER > 101.5 F  (temperatures < 101.5 F are not significant)   Call MD for:  extreme fatigue   Complete by: As directed    Call MD for:  persistant dizziness or light-headedness   Complete by: As directed  Call MD for:  persistant nausea and vomiting   Complete by: As directed    Call MD for:  redness, tenderness, or signs of infection (pain, swelling, redness, odor or green/yellow discharge around incision site)   Complete by: As directed    Call MD for:  severe uncontrolled pain   Complete by: As directed    Diet - low sodium heart healthy    Complete by: As directed    Start with a bland diet such as soups, liquids, starchy foods, low fat foods, etc. the first few days at home. Gradually advance to a solid, low-fat, high fiber diet by the end of the first week at home.   Add a fiber supplement to your diet (Metamucil, etc) If you feel full, bloated, or constipated, stay on a full liquid or pureed/blenderized diet for a few days until you feel better and are no longer constipated.   Discharge instructions   Complete by: As directed    See Discharge Instructions If you are not getting better after two weeks or are noticing you are getting worse, contact our office [phone] for further advice.  We may need to adjust your medications, re-evaluate you in the office, send you to the emergency room, or see what other things we can do to help. The clinic staff is available to answer your questions during regular business hours (8:30am-5pm).  Please don't hesitate to call and ask to speak to one of our nurses for clinical concerns.    A surgeon from [facility] is always on call at the hospitals 24 hours/day If you have a medical emergency, go to the nearest emergency room or call 911.   Discharge wound care:   Complete by: As directed    It is good for closed incisions and even open wounds to be washed every day.  Shower every day.  Short baths are fine.  Wash the incisions and wounds clean with soap & water.    You may leave closed incisions open to air if it is dry.   You may cover the incision with clean gauze & replace it after your daily shower for comfort.  TEGADERM:  You have clear gauze band-aid dressings over your closed incision(s).  Remove the dressings 2 days after surgery.   Driving Restrictions   Complete by: As directed    You may drive when: - you are no longer taking narcotic prescription pain medication - you can comfortably wear a seatbelt - you can safely make sudden turns/stops without pain.    Increase activity slowly   Complete by: As directed    Start light daily activities --- self-care, walking, climbing stairs- beginning the day after surgery.  Gradually increase activities as tolerated.  Control your pain to be active.  Stop when you are tired.  Ideally, walk several times a day, eventually an hour a day.   Most people are back to most day-to-day activities in a few weeks.  It takes 4-6 weeks to get back to unrestricted, intense activity. If you can walk 30 minutes without difficulty, it is safe to try more intense activity such as jogging, treadmill, bicycling, low-impact aerobics, swimming, etc. Save the most intensive and strenuous activity for last (Usually 4-8 weeks after surgery) such as sit-ups, heavy lifting, contact sports, etc.  Refrain from any intense heavy lifting or straining until you are off narcotics for pain control.  You will have off days, but things should improve week-by-week. DO NOT  PUSH THROUGH PAIN.  Let pain be your guide: If it hurts to do something, don't do it.   Lifting restrictions   Complete by: As directed    If you can walk 30 minutes without difficulty, it is safe to try more intense activity such as jogging, treadmill, bicycling, low-impact aerobics, swimming, etc. Save the most intensive and strenuous activity for last (Usually 4-8 weeks after surgery) such as sit-ups, heavy lifting, contact sports, etc.   Refrain from any intense heavy lifting or straining until you are off narcotics for pain control.  You will have off days, but things should improve week-by-week. DO NOT PUSH THROUGH PAIN.  Let pain be your guide: If it hurts to do something, don't do it.  Pain is your body warning you to avoid that activity for another week until the pain goes down.   May shower / Bathe   Complete by: As directed    May walk up steps   Complete by: As directed    Remove dressing in 48 hours   Complete by: As directed    Make sure all dressings have been  removed on the second day after surgery = 12/14 Sunday Leave incisions open to air.  OK to cover incisions with gauze or bandages as desired   Sexual Activity Restrictions   Complete by: As directed    You may have sexual intercourse when it is comfortable. If it hurts to do something, stop.       Allergies as of 06/24/2024   No Known Allergies      Medication List     TAKE these medications    acetaminophen  650 MG CR tablet Commonly known as: TYLENOL  Take 650 mg by mouth every 8 (eight) hours as needed for pain.   CALCIUM  GUMMIES PO Take 2 each by mouth in the morning.   cyclobenzaprine  5 MG tablet Commonly known as: FLEXERIL  Take 1 tablet (5 mg total) by mouth 3 (three) times daily.   diclofenac  Sodium 1 % Gel Commonly known as: VOLTAREN  Apply 1 Application topically 4 (four) times daily as needed (pain.).   FIBER GUMMIES PO Take 3 each by mouth in the morning.   lisinopril -hydrochlorothiazide  10-12.5 MG tablet Commonly known as: ZESTORETIC Take 1 tablet by mouth in the morning.   MELATONIN PO Take 15 mg by mouth at bedtime as needed (sleep).   meloxicam 15 MG tablet Commonly known as: MOBIC Take 15 mg by mouth daily as needed for pain.   mupirocin ointment 2 % Commonly known as: BACTROBAN Apply 1 Application topically daily.   PROBIOTIC-10 PO Take 1 capsule by mouth in the morning.   traMADol  50 MG tablet Commonly known as: ULTRAM  Take 1-2 tablets (50-100 mg total) by mouth every 6 (six) hours as needed for moderate pain (pain score 4-6) or severe pain (pain score 7-10).   Tremfya 100 MG/ML pen Generic drug: guselkumab Inject 1 mL into the skin every 8 (eight) weeks.   Vitamin D3 125 MCG (5000 UT) Tabs Take 5,000 Units by mouth in the morning.               Discharge Care Instructions  (From admission, onward)           Start     Ordered   06/20/24 0000  Discharge wound care:       Comments: It is good for closed incisions and even  open wounds to be washed every day.  Shower every day.  Short  baths are fine.  Wash the incisions and wounds clean with soap & water.    You may leave closed incisions open to air if it is dry.   You may cover the incision with clean gauze & replace it after your daily shower for comfort.  TEGADERM:  You have clear gauze band-aid dressings over your closed incision(s).  Remove the dressings 2 days after surgery.   06/20/24 1256            Significant Diagnostic Studies:  No results found for this or any previous visit (from the past 72 hours).  No results found.  Past Medical History:  Diagnosis Date   Complication of anesthesia    Family health problem    mother had chrons and history of blood clot   History of kidney stones    Hypertension    PONV (postoperative nausea and vomiting)    Psoriasis     Past Surgical History:  Procedure Laterality Date   APPENDECTOMY  1996   CHOLECYSTECTOMY  1999   laparoscopic   COLECTOMY, RIGHT, ROBOT-ASSISTED N/A 06/20/2024   Procedure: COLECTOMY, RIGHT, ROBOT-ASSISTED;  Surgeon: Sheldon Standing, MD;  Location: [facility];  Service: General;  Laterality: N/A;  ROBOTIC COLECTOMY PROXIMAL   MENISCUS REPAIR Left 2010   TUBAL LIGATION  1977    Social History   Socioeconomic History   Marital status: Single    Spouse name: Not on file   Number of children: Not on file   Years of education: Not on file   Highest education level: Not on file  Occupational History   Not on file  Tobacco Use   Smoking status: Never   Smokeless tobacco: Never  Vaping Use   Vaping status: Never Used  Substance and Sexual Activity   Alcohol use: Not Currently   Drug use: Never   Sexual activity: Not on file  Other Topics Concern   Not on file  Social History Narrative   Not on file   Social Drivers of Health   Tobacco Use: Low Risk (06/20/2024)   Patient History    Smoking Tobacco Use: Never    Smokeless Tobacco Use: Never    Passive Exposure: Not  on file  Financial Resource Strain: Not on file  Food Insecurity: No Food Insecurity (06/20/2024)   Epic    Worried About Programme Researcher, Broadcasting/film/video in the Last Year: Never true    Ran Out of Food in the Last Year: Never true  Transportation Needs: No Transportation Needs (06/20/2024)   Epic    Lack of Transportation (Medical): No    Lack of Transportation (Non-Medical): No  Physical Activity: Not on file  Stress: No Stress Concern Present (08/16/2021)   Received from [facility] of Occupational Health - Occupational Stress Questionnaire    Feeling of Stress : Only a little  Social Connections: Socially Integrated (06/20/2024)   Social Connection and Isolation Panel    Frequency of Communication with Friends and Family: Twice a week    Frequency of Social Gatherings with Friends and Family: Twice a week    Attends Religious Services: 1 to 4 times per year    Active Member of [employer] or Organizations: Yes    Attends Banker Meetings: 1 to 4 times per year    Marital Status: Married  Catering Manager Violence: Not At Risk (06/20/2024)   Epic    Fear of Current or Ex-Partner: No    Emotionally Abused:  No    Physically Abused: No    Sexually Abused: No  Depression (PHQ2-9): Not on file  Alcohol Screen: Not on file  Housing: Low Risk (06/20/2024)   Epic    Unable to Pay for Housing in the Last Year: No    Number of Times Moved in the Last Year: 0    Homeless in the Last Year: No  Utilities: Not At Risk (06/20/2024)   Epic    Threatened with loss of utilities: No  Health Literacy: Not on file    History reviewed. No pertinent family history.  Current Facility-Administered Medications  Medication Dose Route Frequency Provider Last Rate Last Admin   0.9 %  sodium chloride  infusion  250 mL Intravenous PRN Sheldon Standing, MD       acetaminophen  (TYLENOL ) tablet 1,000 mg  1,000 mg Oral Q6H Brietta Manso, MD   1,000 mg at 06/24/24 0541   alum & mag  hydroxide-simeth (MAALOX/MYLANTA) 200-200-20 MG/5ML suspension 30 mL  30 mL Oral Q6H PRN Sheldon Standing, MD       cyclobenzaprine  (FLEXERIL ) tablet 5 mg  5 mg Oral TID Sheldon Standing, MD   5 mg at 06/23/24 2109   diclofenac  Sodium (VOLTAREN ) 1 % topical gel 1 Application  1 Application Topical QID PRN Sheldon Standing, MD       diphenhydrAMINE  (BENADRYL ) 12.5 MG/5ML elixir 12.5 mg  12.5 mg Oral Q6H PRN Sheldon Standing, MD       Or   diphenhydrAMINE  (BENADRYL ) injection 12.5 mg  12.5 mg Intravenous Q6H PRN Sheldon Standing, MD       enoxaparin  (LOVENOX ) injection 40 mg  40 mg Subcutaneous Q24H Sheldon Standing, MD   40 mg at 06/23/24 9165   feeding supplement (ENSURE SURGERY) liquid 237 mL  237 mL Oral BID BM Sheldon Standing, MD   237 mL at 06/22/24 9061   gabapentin  (NEURONTIN ) capsule 200 mg  200 mg Oral QHS Jem Castro, MD   200 mg at 06/23/24 2109   hydrALAZINE  (APRESOLINE ) injection 10 mg  10 mg Intravenous Q2H PRN Sheldon Standing, MD       hydrochlorothiazide  (HYDRODIURIL ) tablet 12.5 mg  12.5 mg Oral Daily Sheldon Standing, MD   12.5 mg at 06/23/24 9165   HYDROmorphone  (DILAUDID ) injection 0.5-2 mg  0.5-2 mg Intravenous Q4H PRN Sheldon Standing, MD       lisinopril  (ZESTRIL ) tablet 10 mg  10 mg Oral Daily Sheldon Standing, MD   10 mg at 06/23/24 9166   loperamide  (IMODIUM ) capsule 2 mg  2 mg Oral TID AC Thomas, Alicia, MD   2 mg at 06/23/24 1616   magic mouthwash  15 mL Oral QID PRN Sheldon Standing, MD       melatonin tablet 3 mg  3 mg Oral QHS PRN Sheldon Standing, MD   3 mg at 06/21/24 2128   menthol  (CEPACOL) lozenge 3 mg  1 lozenge Oral PRN Sheldon Standing, MD       metoprolol  tartrate (LOPRESSOR ) injection 5 mg  5 mg Intravenous Q6H PRN Sheldon Standing, MD       naphazoline-glycerin  (CLEAR EYES REDNESS) ophth solution 1-2 drop  1-2 drop Both Eyes QID PRN Sheldon Standing, MD       naproxen  (NAPROSYN ) tablet 250 mg  250 mg Oral BID PRN Sheldon Standing, MD       ondansetron  (ZOFRAN ) tablet 4 mg  4 mg Oral Q6H PRN Sheldon Standing, MD       Or   ondansetron  (ZOFRAN )  injection 4 mg  4 mg Intravenous Q6H PRN Sheldon Standing, MD       phenol (CHLORASEPTIC) mouth spray 2 spray  2 spray Mouth/Throat PRN Sheldon Standing, MD       polycarbophil (FIBERCON) tablet 625 mg  625 mg Oral BID Sheldon Standing, MD   625 mg at 06/23/24 2109   prochlorperazine  (COMPAZINE ) tablet 10 mg  10 mg Oral Q6H PRN Sheldon Standing, MD       Or   prochlorperazine  (COMPAZINE ) injection 5-10 mg  5-10 mg Intravenous Q6H PRN Sheldon Standing, MD       simethicone  (MYLICON) chewable tablet 40 mg  40 mg Oral Q6H PRN Sheldon Standing, MD   40 mg at 06/21/24 1348   sodium chloride  (OCEAN) 0.65 % nasal spray 1-2 spray  1-2 spray Each Nare Q6H PRN Sheldon Standing, MD       sodium chloride  flush (NS) 0.9 % injection 3 mL  3 mL Intravenous Q12H Dhaval Woo, MD   3 mL at 06/23/24 2113   sodium chloride  flush (NS) 0.9 % injection 3 mL  3 mL Intravenous PRN Sheldon Standing, MD       traMADol  (ULTRAM ) tablet 50-100 mg  50-100 mg Oral Q6H PRN Sheldon Standing, MD   50 mg at 06/21/24 2128     Allergies[1]  Signed:   Standing KYM Sheldon, MD, FACS, MASCRS Esophageal, Gastrointestinal & Colorectal Surgery Robotic and Minimally Invasive Surgery  [facility] [address], [address] [place], [place] [address] [phone] Fax [phone] Main  CONTACT INFORMATION: Weekday (9AM-5PM): Call [facility] main office at [phone] Weeknight (5PM-9AM) or Weekend/Holiday: Check EPIC Web Links tab & use AMION (password  [email or web address]) for General Surgery [facility] coverage  Please, DO NOT use SecureChat  (it is not reliable communication to reach operating surgeons & will lead to a delay in care).   Epic staff messaging available for outpatient concerns needing 1-2 business day response.      06/24/2024, 8:24 AM       [1] No Known Allergies

## 2024-06-24 NOTE — Plan of Care (Signed)
  Problem: Education: Goal: Understanding of discharge needs will improve Outcome: Progressing   Problem: Nutritional: Goal: Will attain and maintain optimal nutritional status will improve Outcome: Progressing   Problem: Education: Goal: Knowledge of General Education information will improve Description: Including pain rating scale, medication(s)/side effects and non-pharmacologic comfort measures Outcome: Progressing

## 2024-06-24 NOTE — Progress Notes (Shared)
 Discharge medication delivered to patient at the bedside

## 2024-06-24 NOTE — Progress Notes (Signed)
 OT Cancellation Note  Patient Details Name: Katie Tanner MRN: [id number] DOB: 1950-12-19   Cancelled Treatment:    Reason Eval/Treat Not Completed: OT screened, no needs identified, will sign off  Caregiver at bedside and patient dressed and independent in bathroom completing readiness for discharge. Signing off for OT with no needs identified. Thank you for this referral.   [facility] OT/L Acute Rehabilitation Department  [phone]    06/24/2024, 9:13 AM

## 2024-06-26 LAB — SURGICAL PATHOLOGY

## 2024-06-27 ENCOUNTER — Ambulatory Visit: Payer: Self-pay | Admitting: Surgery

## 2024-06-27 NOTE — Progress Notes (Signed)
 GI Tumor Board patient referral:   Katie Tanner  1951-02-19 [id number]  CARE TEAM: Patient Care Team: Pridgen, Taylar, NP as PCP - General (Family Medicine) Livingston Rigg, MD as Consulting Physician (Dermatology) Sheldon Standing, MD as Consulting Physician (General Surgery) Dianna Specking, MD as Consulting Physician (Gastroenterology) Marvene Prentice SAUNDERS, FNP (Family Medicine)  Diagnosis: Neuroendocrine tumor ileum  MD Care Team:  (as above)  Focus of discussion: Radiology & Pathology reviews - comprehensive   Please send to GI Tumor Coordinator Elray Romano)  in [place] message and attach the medical record to it

## 2024-07-02 ENCOUNTER — Other Ambulatory Visit: Payer: Self-pay | Admitting: *Deleted

## 2024-07-02 NOTE — Progress Notes (Signed)
 The proposed treatment discussed in conference is for discussion purpose only and is not a binding recommendation.  The patients have not been physically examined, or presented with their treatment options.  Therefore, final treatment plans cannot be decided.

## 2024-07-08 DIAGNOSIS — K6389 Other specified diseases of intestine: Secondary | ICD-10-CM

## 2024-07-08 NOTE — Progress Notes (Signed)
 PATIENT NAVIGATOR PROGRESS NOTE  Name: Katie Tanner Date: 07/08/2024 MRN: [id number]  DOB: 12-16-1950   Patient's case was discussed during the 12/24 GI Multidisciplinary Conference. Recommendations during conference was for patient to be referred to a medical oncologist.  Per Dr. Demetra request, patient was contacted and asked where she would prefer to be seen.  Patient asked to be seen at [facility] location.  Med/Onc referral placed and patient was informed she would receive a call from our scheduling team to schedule patient.   Patient verbalized understanding. All questions were answered during phone call.

## 2024-07-14 NOTE — Progress Notes (Signed)
 "   PATIENT NAME: Katie Tanner MRN: [id number] DOB: [birth year] PHYSICIANS:  REFERRING PHYSICIAN:  Self  CARE TEAM:   Patient Care Team: Marvene Prentice Ingle, NP as PCP - General (Family Medicine) Gross, Elspeth Bitter, MD as Consulting Provider (General Surgery) Dianna Jerrell BROCKS, MD (Gastroenterology)  CONSULTING PROVIDER:  ELSPETH BITTER SCHULTZE, MD  DATE OF ENCOUNTER: 07/14/2024    Interval History:   The patient returns to the office after undergoing robotic proximal right colectomy for a cecal mass on 06/20/2024  Pathology: Colon lipoma.  1 lymph node with neuroendocrine tumor.  30 other LN negative.  One 4 mm focus of ileum consistent with neuroendocrine tumor.  Level of Interpreter Services: No interpreter needed (no language barrier)  Patient comes today with her husband.  Her appetite is not totally back but she is eating most foods.  Sometimes feels like it runs through her but is moving her bowels once a day.  She has been walking with her husband.  She feels like her back pain is coming back and wondering if is safe to do her Tylenol  arthritis.  No nausea or vomiting.  No fevers or chills.  No bleeding.  She asked many appropriate questions      Labs, Imaging and Diagnostic Testing:  Located in 'Care Everywhere' section of Epic EMR chart   [facility] NOTES:  Located in 'Care Everywhere' section of Epic EMR chart   SURGERY NOTES:  Located in 'Care Everywhere' section of Epic EMR chart   PATHOLOGY:  Located in 'Care Everywhere' section of Epic EMR chart     Physical Examination:   There is no height or weight on file to calculate BMI.   Constitutional: Not cachectic.  Hygeine adequate.   Eyes: Normal extraocular movements. Sclera nonicteric Neuro: No major focal sensory defects.  No major motor deficits. Psych:  No severe agitation.  No severe anxiety.  Judgment & insight Adequate, Oriented x4, HENT: Normocephalic, Mucus membranes  moist.  No thrush.   Neck: Supple, No tracheal deviation.   Chest: Good respiratory excursion.  No audible wheezing CV:  No major extremity edema Ext: No obvious deformity or contracture.  Edema: not present.  No cyanosis Skin:  Warm and dry Musculoskeletal:   Mobility: no assist device moving easily without restrictions  Abdomen: Incisions Clean & dry with normal healing ridge Nontender.  Soft.   Nondistended.    Gen:  Inguinal hernia: Not present.  Inguinal lymph nodes: without lymphadenopathy.    Rectal: (Deferred)       Assessment and Plan:   Katie Tanner is a 74 y.o. female recovering s/p robotic resection of proximal right colon and distal ileum.  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  No adenoma or adenocarcinoma.  Incidental neuroendocrine tumor 4 mm focus in distal ileum with 1 of 31 lymph nodes positive..  There are no diagnoses linked to this encounter.   We discussed this at GI tumor board.  I think this is an incidentally early noted neuroendocrine tumor.  Odds are survival is very high.  Make sense to follow-up with the cancer center for possible testing such as dotatate scan, chromogranin A, etc.  She is appointment to see Dr. Autumn.  I think it is safe for her to go back on her immunosuppressive Tremfya for her psoriatic arthritis etc.  It is safe to control her pain better since she is having recurrent back pain again.  Not needing narcotics.  Sense to wait a couple days until she sees the cancer center just in case.  That probably will help with some of her back pain issues.  Consider follow-up colonoscopy at some point.  My instinct is maybe in 3 years since she just had adenomatous polyps but I will defer to Dr. Dianna.  She is chronically immunosuppressed.  I think she is getting outside the window of complications and is safe to follow-up as needed.  I think she was really worried about the neuroendocrine  tumor and became tearful but was reassured and I think has some relief.  I tried to give her some hope that we have caught things early in survival should be good but it is wise to follow-up with cancer center.  She and her husband were appreciative.  No follow-ups on file.   The plan was discussed in detail with the patient today, who expressed understanding & appreciation.  The patient has my contact information, and understands to call me with any additional questions or concerns in the interval.  I would be happy to see the patient back sooner if the need arises.  Of note, portions of this report may have been transcribed using voice recognition software. Every effort was made to ensure accuracy; however, inadvertent computerized transcription errors may be present.   Any transcriptional errors that result from this process are unintentional.   Elspeth KYM Schultze, MD, FACS, MASCRS Esophageal, Gastrointestinal & Colorectal Surgery Robotic and Minimally Invasive Surgery  [facility]  [address], [address] [place], [place] [address] [phone] Fax [phone] Main              07/14/2024  "

## 2024-07-16 ENCOUNTER — Inpatient Hospital Stay

## 2024-07-16 ENCOUNTER — Inpatient Hospital Stay: Attending: Oncology | Admitting: Oncology

## 2024-07-16 ENCOUNTER — Encounter: Payer: Self-pay | Admitting: Oncology

## 2024-07-16 DIAGNOSIS — C7A8 Other malignant neuroendocrine tumors: Secondary | ICD-10-CM

## 2024-07-16 DIAGNOSIS — C7A012 Malignant carcinoid tumor of the ileum: Secondary | ICD-10-CM | POA: Diagnosis not present

## 2024-07-16 LAB — CBC WITH DIFFERENTIAL (CANCER CENTER ONLY)
Abs Immature Granulocytes: 0 K/uL (ref 0.00–0.07)
Basophils Absolute: 0 K/uL (ref 0.0–0.1)
Basophils Relative: 1 %
Eosinophils Absolute: 0.2 K/uL (ref 0.0–0.5)
Eosinophils Relative: 4 %
HCT: 40.4 % (ref 36.0–46.0)
Hemoglobin: 13.2 g/dL (ref 12.0–15.0)
Immature Granulocytes: 0 %
Lymphocytes Relative: 33 %
Lymphs Abs: 1.5 K/uL (ref 0.7–4.0)
MCH: 27.7 pg (ref 26.0–34.0)
MCHC: 32.7 g/dL (ref 30.0–36.0)
MCV: 84.9 fL (ref 80.0–100.0)
Monocytes Absolute: 0.4 K/uL (ref 0.1–1.0)
Monocytes Relative: 8 %
Neutro Abs: 2.4 K/uL (ref 1.7–7.7)
Neutrophils Relative %: 54 %
Platelet Count: 210 K/uL (ref 150–400)
RBC: 4.76 MIL/uL (ref 3.87–5.11)
RDW: 14.4 % (ref 11.5–15.5)
WBC Count: 4.5 K/uL (ref 4.0–10.5)
nRBC: 0 % (ref 0.0–0.2)

## 2024-07-16 LAB — CMP (CANCER CENTER ONLY)
ALT: 23 U/L (ref 0–44)
AST: 26 U/L (ref 15–41)
Albumin: 4.6 g/dL (ref 3.5–5.0)
Alkaline Phosphatase: 71 U/L (ref 38–126)
Anion gap: 9 (ref 5–15)
BUN: 14 mg/dL (ref 8–23)
CO2: 27 mmol/L (ref 22–32)
Calcium: 10.4 mg/dL — ABNORMAL HIGH (ref 8.9–10.3)
Chloride: 103 mmol/L (ref 98–111)
Creatinine: 0.76 mg/dL (ref 0.44–1.00)
GFR, Estimated: >60 mL/min
Glucose, Bld: 109 mg/dL — ABNORMAL HIGH (ref 70–99)
Potassium: 3.7 mmol/L (ref 3.5–5.1)
Sodium: 139 mmol/L (ref 135–145)
Total Bilirubin: 0.3 mg/dL (ref 0.0–1.2)
Total Protein: 7.7 g/dL (ref 6.5–8.1)

## 2024-07-16 LAB — LACTATE DEHYDROGENASE: LDH: 194 U/L (ref 105–235)

## 2024-07-16 NOTE — Assessment & Plan Note (Signed)
 Please review oncology history for additional details and timeline of events.  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   Pathology revealed one positive lymph node out of thirty-one, a low Ki-67 proliferation index (1%), and a mitotic rate of zero, consistent with a low risk profile.  She remains asymptomatic except for mild post-surgical diarrhea, with no evidence of carcinoid syndrome or other concerning symptoms.  Prognosis is excellent.  - Ordered PET DOTATATE scan to establish baseline and assess for metastatic or residual disease. - Ordered blood work including chromogranin A tumor marker. - Scheduled follow-up in one month to review imaging and laboratory results and assess for new symptoms. - Planned ongoing surveillance with CT scans every 3-6 months after initial PET scan, reserving PET scan for suspicious CT findings. - Recommended repeat colonoscopy every three years, given her family history of polyps, unless otherwise directed by gastroenterology. - Provided education regarding symptoms of carcinoid syndrome (wheezing, chest congestion, palpitations, flushing, severe diarrhea) and instructed her to report these if she develops. - Advised no dietary or activity restrictions; encouraged a diet high in fruits and vegetables, moderation of red meat, and avoidance of greasy or spicy foods due to post-surgical diarrhea.  - Cleared her to continue Tremfya for psoriasis, with no contraindications from an oncology perspective.  I will plan to see her in 1 month for follow-up with PET scan results.

## 2024-07-16 NOTE — Progress Notes (Signed)
 PATIENT NAVIGATOR PROGRESS NOTE  Name: Katie Tanner Date: 07/16/2024 MRN: [id number]  DOB: [birth year]   Reason for visit:  Initial Med/Onc appt with Dr. Chinita Pasam  Comments:   Met with patient while she was in office during her initial consult with Dr. Autumn.  Patient was given Journey's binder with information specific to her diagnosis. Patient was also given my direct contact information and was instructed to contact office with any questions or concerns. Patient verbalized understanding.   Time spent counseling/coordinating care: 15-30 minutes

## 2024-07-16 NOTE — Progress Notes (Signed)
 "  [facility]  ONCOLOGY CONSULT NOTE   PATIENT NAME: Katie Tanner   MR#: [id number] DOB: July 21, 1950  DATE OF SERVICE: 07/16/2024   REFERRING PROVIDER  Elspeth Schultze, MD  Patient Care Team: Domenick Loma, NP as PCP - General (Family Medicine) Livingston Rigg, MD as Consulting Physician (Dermatology) Schultze Elspeth, MD as Consulting Physician (General Surgery) Dianna Specking, MD as Consulting Physician (Gastroenterology) Marvene Prentice SAUNDERS, FNP (Family Medicine) Ardis, Evalene CROME, RN as Oncology Nurse Navigator Ifeoluwa Beller, Chinita, MD as Consulting Physician (Oncology)    CHIEF COMPLAINT/ PURPOSE OF CONSULTATION:   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  ASSESSMENT & PLAN:   Katie Tanner is a 74 y.o. 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   Pathology revealed one positive lymph node out of thirty-one, a low Ki-67 proliferation index (1%), and a mitotic rate of zero, consistent with a low risk profile.  She remains asymptomatic except for mild post-surgical diarrhea, with no evidence of carcinoid syndrome or other concerning symptoms.  Prognosis is excellent.  - Ordered PET DOTATATE scan to establish baseline and assess for metastatic or residual disease. - Ordered blood work including chromogranin A tumor marker. - Scheduled follow-up in one month to review imaging and laboratory results and assess for new symptoms. - Planned ongoing  surveillance with CT scans every 3-6 months after initial PET scan, reserving PET scan for suspicious CT findings. - Recommended repeat colonoscopy every three years, given her family history of polyps, unless otherwise directed by gastroenterology. - Provided education regarding symptoms of carcinoid syndrome (wheezing, chest congestion, palpitations, flushing, severe diarrhea) and instructed her to report these if she develops. - Advised no dietary or activity restrictions; encouraged a diet high in fruits and vegetables, moderation of red meat, and avoidance of greasy or spicy foods due to post-surgical diarrhea.  - Cleared her to continue Tremfya for psoriasis, with no contraindications from an oncology perspective.  I will plan to see her in 1 month for follow-up with PET scan results.    I reviewed lab results and outside records for this visit and discussed relevant results with the patient. Diagnosis, plan of care and treatment options were also discussed in detail with the patient. Opportunity provided to ask questions and answers provided to her apparent satisfaction. Provided instructions to call our clinic with any problems, questions or concerns prior to return visit. I recommended to continue follow-up with PCP and sub-specialists. She verbalized understanding and agreed with the plan. No barriers to learning was detected.  NCCN guidelines have been consulted in the planning of this patients care.  Chinita Patten, MD  07/16/2024 5:43 PM  [facility] - A DEPT OF [facility] [address] [place] [address] Dept: [phone] Dept Fax: [phone]   HISTORY OF PRESENTING ILLNESS:   I have reviewed her chart and materials related to her cancer extensively and collaborated history with the patient. Summary of oncologic history is as follows:  ONCOLOGY HISTORY:  Patient has a personal  and family history of  colon polyps and hence has been getting colonoscopy every 3 years at [facility] gastroenterology, under the direction of Dr. Dianna.  During her colonoscopy in September 2025, 1 small sigmoid colon polyp was removed.  Incidentally noted was a mass in the cecum, not circumferential.  Polypoid and nonobstructing.  Pathology was not consistent with adenocarcinoma but the lesion could be seen on CT scan on 04/24/2024 as well.  Given size of the mass and location, it was not felt to be endoscopically removal and therefore surgical consultation was requested.  Patient saw Dr. Sheldon for consultation on 05/21/2024 and recommendation made for robotic proximal right colectomy with anastomosis.  On 06/20/2024, she underwent robotic right proximal colectomy.  Final pathology showed incidental low-grade neuroendocrine tumor (mucosal/submucosal nodule in the terminal ileum) and 1 lymph node.  1 out of 31 lymph nodes was positive for neuroendocrine tumor.  Margins negative.  First polypoid ileocecal area with submucosal lipoma.  Second polypoid cecal lesion with extensive lymphoid (follicular) hyperplasia.  Neuroendocrine tumor size measured 4 mm, unifocal, grade 1, well-differentiated with Ki-67 index of 1%, mitotic rate essentially 0.  Lymphovascular invasion was equivocal.  All margins were negative for tumor.  pT1, pN1, Mx.   Her case was discussed in multidisciplinary GI tumor conference on 07/02/2024.  Plan is to proceed with staging PET dotatate scan.  No adjuvant therapy is recommended for this low-grade, well-differentiated neuroendocrine tumor, which has been completely resected.  On her consultation with us  on 07/16/2024, request placed for PET dotatate scan and chromogranin A.  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  History of Present Illness Katie Tanner is a 74 year old female with recently resected well-differentiated neuroendocrine tumor of the terminal ileum who presents for initial oncology consultation and surveillance.  The lesion was indeterminate on initial colonoscopy, leading to referral and subsequent laparoscopic resection. Pathology revealed Ki-67 1%, mitotic rate zero, one of thirty-one lymph nodes positive, and negative surgical margins.  Since surgery, she has experienced intermittent diarrhea and is working toward achieving regular bowel movements. She denies symptoms commonly associated with neuroendocrine tumors, including wheezing, chest congestion, dyspnea, palpitations, and facial flushing. She reports mild incisional discomfort with coughing or sneezing, but her surgical scars have otherwise healed without complication. She denies new or enlarging masses, abnormal bleeding, ecchymosis, weight loss, fevers, chills, or night sweats.  She inquired about the safety of continuing Tremfya for psoriasis, which she receives every eight weeks. She also sought guidance regarding dietary or activity restrictions.   MEDICAL HISTORY:  Past Medical History:  Diagnosis Date   Complication of anesthesia    Family health problem    mother had chrons and history of blood clot   History of kidney stones    Hypertension    PONV (postoperative nausea and vomiting)    Psoriasis     SURGICAL HISTORY: Past Surgical History:  Procedure  Laterality Date   APPENDECTOMY  1996   CHOLECYSTECTOMY  1999   laparoscopic   COLECTOMY, RIGHT, ROBOT-ASSISTED N/A 06/20/2024   Procedure: COLECTOMY, RIGHT, ROBOT-ASSISTED;  Surgeon: Sheldon Standing, MD;  Location: [facility];  Service: General;  Laterality: N/A;  ROBOTIC COLECTOMY PROXIMAL   MENISCUS REPAIR Left 2010   TUBAL LIGATION  1977    SOCIAL HISTORY: She reports that she has never smoked. She has never used smokeless tobacco. She reports that she does not currently use alcohol. She reports that she does not use drugs. Social History   Socioeconomic History   Marital status: Single    Spouse name: Not on file   Number of children: Not on file   Years of education: Not on file   Highest education level: Not on file  Occupational History   Not on file  Tobacco Use   Smoking status: Never   Smokeless tobacco: Never  Vaping Use   Vaping status: Never Used  Substance and Sexual Activity   Alcohol use: Not Currently   Drug use: Never   Sexual activity: Not on file  Other Topics Concern   Not on file  Social History Narrative   Not on file   Social Drivers of Health   Tobacco Use: Low Risk  (07/14/2024)   Received from [facility]   Patient History    Smoking Tobacco Use: Never    Smokeless Tobacco Use: Never    Passive Exposure: Not on file  Financial Resource Strain: Not on file  Food Insecurity: No Food Insecurity (07/16/2024)   Epic    Worried About Programme Researcher, Broadcasting/film/video in the Last Year: Never true    Ran Out of Food in the Last Year: Never true  Transportation Needs: No Transportation Needs (07/16/2024)   Epic    Lack of Transportation (Medical): No    Lack of Transportation (Non-Medical): No  Physical Activity: Not on file  Stress: No Stress Concern Present (08/16/2021)   Received from [facility] - Occupational Stress Questionnaire    Feeling of Stress : Only a little  Social Connections: Socially  Integrated (06/20/2024)   Social Connection and Isolation Panel    Frequency of Communication with Friends and Family: Twice a week    Frequency of Social Gatherings with Friends and Family: Twice a week    Attends Religious Services: 1 to 4 times per year    Active Member of [employer] or Organizations: Yes    Attends Banker Meetings: 1 to 4 times per year    Marital Status: Married  Catering Manager Violence: Not At Risk (07/16/2024)   Epic    Fear of Current or Ex-Partner: No    Emotionally Abused: No    Physically Abused: No    Sexually Abused: No  Depression (PHQ2-9): Low Risk (07/16/2024)   Depression (PHQ2-9)    PHQ-2 Score: 0  Alcohol Screen: Not on file  Housing: Low Risk (07/16/2024)   Epic    Unable to Pay for Housing in the Last Year: No    Number of Times Moved in the Last Year: 0    Homeless in the Last Year: No  Utilities: Not At Risk (07/16/2024)   Epic    Threatened with loss of utilities: No  Health Literacy: Not on file    FAMILY HISTORY: Family History  Problem Relation Age of Onset   Deep vein thrombosis Mother    Hypertension Mother  Hyperlipidemia Mother    Obesity Mother    Crohn's disease Mother    Hypertension Father        heart valve disease   Hyperlipidemia Father    Hypertension Brother    Hyperlipidemia Brother     ALLERGIES:  She has no known allergies.  MEDICATIONS:  Current Outpatient Medications  Medication Sig Dispense Refill   acetaminophen  (TYLENOL ) 650 MG CR tablet Take 650 mg by mouth every 8 (eight) hours as needed for pain.     Calcium -Phosphorus-Vitamin D (CALCIUM  GUMMIES PO) Take 2 each by mouth in the morning.     Cholecalciferol (VITAMIN D3) 125 MCG (5000 UT) TABS Take 5,000 Units by mouth in the morning.     diclofenac  Sodium (VOLTAREN ) 1 % GEL Apply 1 Application topically 4 (four) times daily as needed (pain.).     FIBER GUMMIES PO Take 3 each by mouth in the morning.     guselkumab (TREMFYA) 100 MG/ML pen  Inject 1 mL into the skin every 8 (eight) weeks.     lisinopril -hydrochlorothiazide  (ZESTORETIC) 10-12.5 MG tablet Take 1 tablet by mouth in the morning.     MELATONIN PO Take 15 mg by mouth at bedtime as needed (sleep).     meloxicam (MOBIC) 15 MG tablet Take 15 mg by mouth daily as needed for pain.     Probiotic Product (PROBIOTIC-10 PO) Take 1 capsule by mouth in the morning.     No current facility-administered medications for this visit.    REVIEW OF SYSTEMS:    Review of Systems - Oncology  All other pertinent systems were reviewed with the patient and are negative.  PHYSICAL EXAMINATION:    Onc Performance Status - 07/16/24 1119       ECOG Perf Status   ECOG Perf Status Restricted in physically strenuous activity but ambulatory and able to carry out work of a light or sedentary nature, e.g., light house work, office work      KPS SCALE   KPS % SCORE Normal activity with effort, some s/s of disease         Vitals reviewed.   Physical Exam Constitutional:      General: She is not in acute distress.    Appearance: Normal appearance.  HENT:     Head: Normocephalic and atraumatic.  Cardiovascular:     Rate and Rhythm: Normal rate and regular rhythm.     Heart sounds: Normal heart sounds.  Pulmonary:     Effort: Pulmonary effort is normal. No respiratory distress.     Breath sounds: Normal breath sounds.  Abdominal:     General: There is no distension.     Comments: Laparoscopic scars are well-healed.  Neurological:     General: No focal deficit present.     Mental Status: She is alert and oriented to person, place, and time.  Psychiatric:        Mood and Affect: Mood normal.        Behavior: Behavior normal.     LABORATORY DATA:   I have reviewed the data as listed.  Results for orders placed or performed in visit on 07/16/24  Lactate dehydrogenase  Result Value Ref Range   LDH 194 105 - 235 U/L  CMP (Cancer Center only)  Result Value Ref Range    Sodium 139 135 - 145 mmol/L   Potassium 3.7 3.5 - 5.1 mmol/L   Chloride 103 98 - 111 mmol/L   CO2 27 22 - 32 mmol/L  Glucose, Bld 109 (H) 70 - 99 mg/dL   BUN 14 8 - 23 mg/dL   Creatinine 9.23 9.55 - 1.00 mg/dL   Calcium  10.4 (H) 8.9 - 10.3 mg/dL   Total Protein 7.7 6.5 - 8.1 g/dL   Albumin 4.6 3.5 - 5.0 g/dL   AST 26 15 - 41 U/L   ALT 23 0 - 44 U/L   Alkaline Phosphatase 71 38 - 126 U/L   Total Bilirubin 0.3 0.0 - 1.2 mg/dL   GFR, Estimated >39 >39 mL/min   Anion gap 9 5 - 15  CBC with Differential (Cancer Center Only)  Result Value Ref Range   WBC Count 4.5 4.0 - 10.5 K/uL   RBC 4.76 3.87 - 5.11 MIL/uL   Hemoglobin 13.2 12.0 - 15.0 g/dL   HCT 59.5 63.9 - 53.9 %   MCV 84.9 80.0 - 100.0 fL   MCH 27.7 26.0 - 34.0 pg   MCHC 32.7 30.0 - 36.0 g/dL   RDW 85.5 88.4 - 84.4 %   Platelet Count 210 150 - 400 K/uL   nRBC 0.0 0.0 - 0.2 %   Neutrophils Relative % 54 %   Neutro Abs 2.4 1.7 - 7.7 K/uL   Lymphocytes Relative 33 %   Lymphs Abs 1.5 0.7 - 4.0 K/uL   Monocytes Relative 8 %   Monocytes Absolute 0.4 0.1 - 1.0 K/uL   Eosinophils Relative 4 %   Eosinophils Absolute 0.2 0.0 - 0.5 K/uL   Basophils Relative 1 %   Basophils Absolute 0.0 0.0 - 0.1 K/uL   Immature Granulocytes 0 %   Abs Immature Granulocytes 0.00 0.00 - 0.07 K/uL    RADIOGRAPHIC STUDIES:  I have personally reviewed the radiological images as listed and agree with the findings in the report.  CT ABDOMEN PELVIS W CONTRAST CLINICAL DATA:  Cecal mass found on recent colonoscopy. Staging. * Tracking Code: BO *  EXAM: CT ABDOMEN AND PELVIS WITH CONTRAST  TECHNIQUE: Multidetector CT imaging of the abdomen and pelvis was performed using the standard protocol following bolus administration of intravenous contrast.  RADIATION DOSE REDUCTION: This exam was performed according to the departmental dose-optimization program which includes automated exposure control, adjustment of the mA and/or kV according  to patient size and/or use of iterative reconstruction technique.  CONTRAST:  ISOVUE -300 IOPAMIDOL  (ISOVUE -300) INJECTION 61%  COMPARISON:  Noncontrast CT on 10/03/2023  FINDINGS: Lower Chest: No acute findings.  Hepatobiliary: A few small benign-appearing hepatic cysts are noted. No suspicious hepatic masses identified. Prior cholecystectomy. No evidence of biliary obstruction.  Pancreas:  No mass or inflammatory changes.  Spleen: Within normal limits in size and appearance.  Adrenals/Urinary Tract: No suspicious masses identified. No evidence of ureteral calculi or hydronephrosis. Unremarkable unopacified urinary bladder.  Stomach/Bowel: 1.4 cm intraluminal polypoid density surrounded by stool is seen in the cecum, which could represent a cecal polyp or stool ball. No other definite masses are identified. No evidence of obstruction, inflammatory process or abnormal fluid collections.  Vascular/Lymphatic: No pathologically enlarged lymph nodes. No acute vascular findings.  Reproductive:  No mass or other significant abnormality.  Other:  None.  Musculoskeletal:  No suspicious bone lesions identified.  IMPRESSION: Small intraluminal polypoid mass versus stool ball in the cecum.  No evidence of metastatic disease within the abdomen or pelvis.  Electronically Signed   By: Norleen DELENA Kil M.D.   On: 04/26/2024 10:31    Orders Placed This Encounter  Procedures   NM PET DOTATATE SKULL  BASE TO MID THIGH    Standing Status:   Future    Expected Date:   07/30/2024    Expiration Date:   07/16/2025    If indicated for the ordered procedure, I authorize the administration of a radiopharmaceutical per Radiology protocol:   Yes    Preferred imaging location?:   [place]   CBC with Differential (Cancer Center Only)    Standing Status:   Future    Number of Occurrences:   1    Expiration Date:   07/16/2025   CMP (Cancer Center only)    Standing Status:   Future     Number of Occurrences:   1    Expiration Date:   07/16/2025   Lactate dehydrogenase    Standing Status:   Future    Number of Occurrences:   1    Expiration Date:   07/16/2025   Chromogranin A    Standing Status:   Future    Number of Occurrences:   1    Expiration Date:   07/16/2025    CODE STATUS:  Code Status History     Date Active Date Inactive Code Status Order ID Comments User Context   06/20/2024 2045 06/24/2024 1500 Full Code [id number]  Sheldon Standing, MD Inpatient    Questions for Most Recent Historical Code Status (Order [id number])     Question Answer   By: Consent: discussion documented in EHR            Future Appointments  Date Time Provider Department Center  08/13/2024  8:30 AM [facility] None  08/13/2024  9:00 AM Anvika Gashi, MD [facility] None     I spent a total of 70 minutes during this encounter with the patient including review of chart and various tests results, discussions about plan of care and coordination of care plan.  This document was completed utilizing speech recognition software. Grammatical errors, random word insertions, pronoun errors, and incomplete sentences are an occasional consequence of this system due to software limitations, ambient noise, and hardware issues. Any formal questions or concerns about the content, text or information contained within the body of this dictation should be directly addressed to the provider for clarification.  "

## 2024-07-18 LAB — CHROMOGRANIN A: Chromogranin A (ng/mL): 74.8 ng/mL (ref 0.0–101.8)

## 2024-07-29 ENCOUNTER — Encounter (HOSPITAL_COMMUNITY)
Admission: RE | Admit: 2024-07-29 | Discharge: 2024-07-29 | Disposition: A | Discharge status: Home / Self Care | Source: Ambulatory Visit | Attending: Oncology | Admitting: Oncology

## 2024-07-29 DIAGNOSIS — C7A8 Other malignant neuroendocrine tumors: Secondary | ICD-10-CM | POA: Insufficient documentation

## 2024-07-29 MED ORDER — COPPER CU 64 DOTATATE 1 MCI/ML IV SOLN
4.0000 | Freq: Once | INTRAVENOUS | Status: AC
  Administered 2024-07-29: 4.07 via INTRAVENOUS

## 2024-08-13 ENCOUNTER — Inpatient Hospital Stay

## 2024-08-13 ENCOUNTER — Inpatient Hospital Stay: Attending: Oncology | Admitting: Oncology

## 2024-08-13 VITALS — BP 125/66 | HR 76 | Temp 97.1°F | Resp 17 | Ht 64.0 in | Wt 138.6 lb

## 2024-08-13 DIAGNOSIS — C7A8 Other malignant neuroendocrine tumors: Secondary | ICD-10-CM

## 2024-08-13 DIAGNOSIS — L281 Prurigo nodularis: Secondary | ICD-10-CM | POA: Diagnosis not present

## 2024-08-13 NOTE — Progress Notes (Unsigned)
 "  [facility]  ONCOLOGY [facility]IN[facility] PROGRESS NOTE   Patient Care Team: Pridgen, Taylar, NP as PCP - General (Family Medicine) Livingston Rigg, MD as Consulting Physician (Dermatology) Sheldon Standing, MD as Consulting Physician (General Surgery) Dianna Specking, MD as Consulting Physician (Gastroenterology) Marvene Prentice SAUNDERS, FNP (Family Medicine) Ardis, Evalene CROME, RN as Oncology Nurse Navigator Autumn Millman, MD as Consulting Physician (Oncology)  PATIENT NAME: Katie [facility]   MR#: [id number] DOB: 1951-03-23  Date of visit: 08/13/2024   ASSESSMENT & PLAN:   Katie [facility] is a 74 y.o.  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 No evidence of dotatate-avid disease on staging PET scan.  On surveillance.  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   Pathology revealed one positive lymph node out of thirty-one, a low Ki-67 proliferation index (1%), and a mitotic rate of zero, consistent with a low risk profile.  She remains asymptomatic except for mild post-surgical diarrhea, with no evidence of carcinoid syndrome or other concerning symptoms.  Prognosis is excellent.  On her consultation with us  on 07/16/2024, request placed for PET dotatate scan and chromogranin A.  Chromogranin A was normal at 74.8 ng/mL.  On 07/29/2024, PET dotatate scan showed no evidence of dotatate avid disease.  Plan to continue surveillance as per NCCN guidelines.  - Planned ongoing surveillance with CT scans every 3-6 months after initial PET scan,  reserving PET scan for suspicious CT findings. - Recommended repeat colonoscopy every three years, given her family history of polyps, unless otherwise directed by gastroenterology. - Provided education regarding symptoms of carcinoid syndrome (wheezing, chest congestion, palpitations, flushing, severe diarrhea) and instructed her to report these if she develops. - Advised no dietary or activity restrictions; encouraged a diet high in fruits and vegetables, moderation of red meat, and avoidance of greasy or spicy foods due to post-surgical diarrhea.  - Ordered surveillance CT scan in 3-4 months.  - Planned annual surveillance imaging after initial follow-up period. - Recommended laboratory evaluation (chromogranin A and chemistries) at time of next scan and at follow-up visits.  - Cleared her to continue Tremfya for psoriasis, with no contraindications from an oncology perspective.  - Planned follow-up in 4 months to review imaging and laboratory studies, including chromogranin A and chemistries.    I reviewed lab results and outside records for this visit and discussed relevant results with the patient. Diagnosis, plan of care and treatment options were also discussed in detail with the patient. Opportunity provided to ask questions and answers provided to her apparent satisfaction. Provided instructions to call our clinic with any problems, questions or concerns prior to return visit. I recommended to continue follow-up with PCP and sub-specialists. She verbalized understanding and agreed with the plan.   NCCN guidelines have been consulted in the planning of this patients care.  I spent a total of 32 minutes during this encounter with the patient including review of chart and various tests results, discussions about plan of care and coordination of care plan.   Millman Autumn, MD  08/13/2024 9:24 AM  [facility] - [facility] DE[facility]  [facility] [address] [place] [address] Dept: [phone] Dept Fax: [phone]    CHIEF COMPLAINT/ REASON FOR VISIT:   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   Current Treatment: Surveillance as per NCCN guidelines.  INTERVAL HISTORY:    Discussed the use of AI scribe software for clinical note transcription with the patient, who gave verbal consent to proceed.  History of Present Illness Katie [facility] is a 74 year old female with a well-differentiated, low-grade neuroendocrine tumor of the terminal ileum, status post curative resection, who presents for routine oncology surveillance.  She underwent robotic proximal right colectomy in December 2025 for a 4 mm, grade 1 neuroendocrine tumor of the terminal ileum with one positive lymph node and negative margins. Postoperative baseline PET scan showed no evidence of active or metastatic disease. Chromogranin A level was 74 (within normal limits), and blood counts and chemistries were stable except for persistently mildly elevated calcium  (10.4-10.5 mg/dL). She is taking plain vitamin D without calcium  supplementation.  She describes intermittent gastrointestinal symptoms, including episodes of diarrhea and constipation, which she attributes to dietary triggers. She maintains adequate oral intake and is working to achieve a balanced diet. No new or concerning gastrointestinal symptoms were reported.  She has a persistent, painful rash on the right buttock since July 2024, previously evaluated by dermatology. Topical salve and steroid injections have not provided relief, and the rash remains unchanged. The area becomes sore when sitting, sometimes requiring her to lie down for comfort.  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 Pathology showed 4 mm, grade 1  tumor with one positive lymph node, negative margins, low Ki-67 (1%), and no mitotic activity. Patient asymptomatic post-surgery except for mild diarrhea; no signs of carcinoid syndrome. PET DOTATATE scan and chromogranin A ordered for baseline assessment; follow-up planned in one month. No adjuvant therapy recommended, prognosis excellent.     I have reviewed the past medical history, past surgical history, social history and family history with the patient and they are unchanged from previous note.  HISTORY OF PRESENT ILLNESS:   ONCOLOGY HISTORY:   Patient has a personal and family history of colon polyps and hence has been getting colonoscopy every 3 years at [facility], under the direction of Dr. Dianna.   During her colonoscopy in September 2025, 1 small sigmoid colon polyp was removed.  Incidentally noted was a mass in the cecum, not circumferential.  Polypoid and nonobstructing.  Pathology was not consistent with adenocarcinoma but the lesion could be seen on CT scan on 04/24/2024 as well.  Given size of the mass and location, it was not felt to be endoscopically removal and therefore surgical consultation was requested.   Patient saw Dr. Sheldon for consultation on 05/21/2024 and recommendation made for robotic proximal right colectomy with anastomosis.   On 06/20/2024, she underwent robotic right proximal colectomy.  Final pathology showed incidental low-grade neuroendocrine tumor (mucosal/submucosal nodule in the terminal ileum) and 1 lymph node.  1 out of 31 lymph nodes was positive for neuroendocrine tumor.  Margins negative.  First polypoid ileocecal area with submucosal lipoma.  Second polypoid cecal lesion with extensive lymphoid (follicular) hyperplasia.  Neuroendocrine tumor size measured 4 mm, unifocal, grade 1, well-differentiated with Ki-67 index of 1%, mitotic rate essentially 0.  Lymphovascular invasion was equivocal.  Tanner margins were negative for tumor.  pT1, pN1,  Mx.    Her case was discussed in multidisciplinary GI tumor conference on 07/02/2024.  Plan is to proceed with staging PET dotatate scan.  No adjuvant therapy is recommended for this low-grade, well-differentiated neuroendocrine tumor, which has been completely resected.   On her consultation with us  on 07/16/2024, request placed for PET dotatate scan and chromogranin A.  Chromogranin A was normal at 74.8 ng/mL.  On 07/29/2024, PET dotatate scan showed no evidence of dotatate avid disease.  Plan to continue surveillance as per NCCN guidelines.  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  ALLERGIES: She has no known allergies.  MEDICATIONS:  Current Outpatient Medications  Medication Sig Dispense Refill   acetaminophen  (TYLENOL ) 650 MG CR tablet Take 650 mg by mouth every 8 (eight) hours as needed for pain.     Cholecalciferol (VITAMIN D3) 125 MCG (5000 UT) TABS Take 5,000 Units by mouth in the morning.     diclofenac  Sodium (VOLTAREN ) 1 % GEL Apply 1 Application topically 4 (four) times daily as needed (pain.).     FIBER GUMMIES PO Take 3 each by mouth in the morning.     guselkumab (TREMFYA) 100 MG/ML pen Inject 1 mL into the skin every 8 (eight) weeks.     lisinopril -hydrochlorothiazide  (ZESTORETIC) 10-12.5 MG tablet Take 1 tablet by mouth in the morning.      MELATONIN PO Take 15 mg by mouth at bedtime as needed (sleep).     Probiotic Product (PROBIOTIC-10 PO) Take 1 capsule by mouth in the morning.     No current facility-administered medications for this visit.    VITALS:   Blood pressure 125/66, pulse 76, temperature (!) 97.1 F (36.2 C), temperature source Temporal, resp. rate 17, height 5' 4 (1.626 m), weight 138 lb 9.6 oz (62.9 kg), SpO2 98%.  Wt Readings from Last 3 Encounters:  08/13/24 138 lb 9.6 oz (62.9 kg)  06/24/24 158 lb 11.7 oz (72 kg)  06/11/24 146 lb (66.2 kg)    Body mass index is 23.79 kg/m.   Onc Performance Status - 08/13/24 0855       ECOG Perf Status   ECOG Perf Status Restricted in physically strenuous activity but ambulatory and able to carry out work of a light or sedentary nature, e.g., light house work, office work      KPS SCALE   KPS % SCORE Able to carry on normal activity, minor s/s of disease          PHYSICAL EXAM:   Physical Exam Constitutional:      General: She is not in acute distress.    Appearance: Normal appearance.  HENT:     Head: Normocephalic and atraumatic.  Cardiovascular:     Rate and Rhythm: Normal rate.     Heart sounds: Normal heart sounds.  Pulmonary:     Effort: Pulmonary effort is normal. No respiratory distress.  Breath sounds: Normal breath sounds.  Abdominal:     General: There is no distension.  Neurological:     General: No focal deficit present.     Mental Status: She is alert and oriented to person, place, and time.  Psychiatric:        Mood and Affect: Mood normal.        Behavior: Behavior normal.      LABORATORY DATA:   I have reviewed the data as listed.  Recent Results (from the past 2160 hours)  Type and screen [facility]     Status: None   Collection Time: 06/11/24 11:02 AM  Result Value Ref Range   ABO/RH(D) B POS    Antibody Screen NEG    Sample Expiration 06/23/2024,2359    Extend sample reason      NO  TRANSFUSIONS OR PREGNANCY IN THE PAST 3 MONTHS Performed at [facility], [address], [place], [place] [address]   Basic metabolic panel per protocol     Status: Abnormal   Collection Time: 06/11/24 11:02 AM  Result Value Ref Range   Sodium 140 135 - 145 mmol/L   Potassium 4.1 3.5 - 5.1 mmol/L   Chloride 103 98 - 111 mmol/L   CO2 26 22 - 32 mmol/L   Glucose, Bld 94 70 - 99 mg/dL    Comment: Glucose reference range applies only to samples taken after fasting for at least 8 hours.   BUN 14 8 - 23 mg/dL   Creatinine, Ser 9.15 0.44 - 1.00 mg/dL   Calcium  10.5 (H) 8.9 - 10.3 mg/dL   GFR, Estimated >39 >39 mL/min    Comment: (NOTE) Calculated using the CKD-EPI Creatinine Equation (2021)    Anion gap 11 5 - 15    Comment: Performed at [facility], [address] [place], [place] [address]  Hemoglobin A1c     Status: None   Collection Time: 06/11/24 11:02 AM  Result Value Ref Range   Hgb A1c MFr Bld 5.4 4.8 - 5.6 %    Comment: (NOTE)         Prediabetes: 5.7 - 6.4         Diabetes: >6.4         Glycemic control for adults with diabetes: <7.0    Mean Plasma Glucose 108 mg/dL    Comment: (NOTE) Performed At: [facility]  [address] [place], [place] [id number] Jennette Shorter MD Ey:[id number]   CBC with Differential     Status: Abnormal   Collection Time: 06/11/24 11:02 AM  Result Value Ref Range   WBC 4.9 4.0 - 10.5 K/uL   RBC 5.53 (H) 3.87 - 5.11 MIL/uL   Hemoglobin 15.5 (H) 12.0 - 15.0 g/dL   HCT 52.4 (H) 63.9 - 53.9 %   MCV 85.9 80.0 - 100.0 fL   MCH 28.0 26.0 - 34.0 pg   MCHC 32.6 30.0 - 36.0 g/dL   RDW 86.6 88.4 - 84.4 %   Platelets 170 150 - 400 K/uL   nRBC 0.0 0.0 - 0.2 %   Neutrophils Relative % 53 %   Neutro Abs 2.6 1.7 - 7.7 K/uL   Lymphocytes Relative 34 %   Lymphs Abs 1.7 0.7 - 4.0 K/uL   Monocytes Relative 10 %   Monocytes Absolute 0.5 0.1 - 1.0 K/uL   Eosinophils Relative 2 %   Eosinophils Absolute 0.1  0.0 - 0.5 K/uL   Basophils Relative 1 %  Basophils Absolute 0.1 0.0 - 0.1 K/uL   Immature Granulocytes 0 %   Abs Immature Granulocytes 0.01 0.00 - 0.07 K/uL    Comment: Performed at [facility], [address], [place], [place] [address]  CEA     Status: None   Collection Time: 06/11/24 11:02 AM  Result Value Ref Range   CEA 1.4 0.0 - 4.7 ng/mL    Comment: (NOTE)                             Nonsmokers          <3.9                             Smokers             <5.6 Roche [employer] Electrochemiluminescence Immunoassay (ECLIA) Values obtained with different assay methods or kits cannot be used interchangeably.  Results cannot be interpreted as absolute evidence of the presence or absence of malignant disease. Performed At: [facility] [place] [address] [place], [place] [id number] Jennette Shorter MD Ey:[id number]   ABO/Rh     Status: None   Collection Time: 06/20/24 12:21 PM  Result Value Ref Range   ABO/RH(D)      B POS Performed at [facility], [address], [place], [place] [address]   Surgical pathology     Status: None   Collection Time: 06/20/24  3:19 PM  Result Value Ref Range   SURGICAL PATHOLOGY      SURGICAL PATHOLOGY CASE: [id number] PATIENT: Katie [facility] Surgical Pathology Report     Clinical History: cecal mass     FINAL MICROSCOPIC DIAGNOSIS:  A. RIGHT COLON: -  Incidental low-grade neuroendocrine tumor (mucosal/submucosal nodule in the terminal ileum)  and 1 lymph node. -  1 of 31 lymph nodes positive for neuroendocrine tumor. -  Margins negative. -  First polypoid ileocecal area with submucosal lipoma -  Second polypoid cecal lesion with extensive lymphoid (follicular) hyperplasia. -  See note and synoptic report below  Note: It is noted that the colectomy was performed for polypoid lesions in the cecum.  The 2 polypoid lesions found in the ileocecal/cecal area consist of a submucosal  lipoma and lymphoid/follicular hyperplasia. Immunohistochemical stains were performed to rule out a lymphoproliferative disorder in the area of lymphoid hyperplasia and CD20, CD10, BCL6 highlight the follicular hyperplasia with appropriate Bcl-2 negativity a nd a CD23 positive follicular dendritic cell meshwork. CD3 and CD5 highlight background small T cells.  Cyclin D1 is appropriately negative.  The proliferation rate by Ki-67 shows an appropriate immuno architecture  Incidentally a small 4 mm mucosal lesion was found in the terminal ileum close to the ileocecal valve.  The tumor is a neuroendocrine tumor which extends into the submucosa and abuts but does not involve the muscularis propria.  In addition, the neuroendocrine tumor is also found in one of the 31 lymph nodes in the subcapsular sinus.  Immunohistochemical stains staining cells strongly for chromogranin, faint/equivocally for CD56 and positive for synaptophysin.  They are negative for TTF-1 and positive for CDX2.  CK7 and CK20 are negative.  The proliferation rate by Ki-67 was performed on both the mucosal lesion and lymph node and is less than 2%.  The lesion was found incidentally and is not associated with a cecal/ileocecal masses discussed radiologically.   This is mucosal/submucosal lesion may or  may not represent the primary; however, the immunohistochemical profile of chromogranin and CDX2 positivity favor a gastrointestinal (small intestinal or colonic) origin.  The below synoptic report is currently interpreted with this lesion as the primary.  Additional radiologic workup is recommended to better assess possible extent of disease.  Dr. Frutoso has peer reviewed selected slides involving the NET and agrees with the interpretation.  A message was left with Dr. Rubie office on 06/26/2024.  ONCOLOGY TABLE:  JEJUNUM AND ILEUM NEUROENDOCRINE TUMOR: Resection  Procedure: Ileocecectomy Tumor Site: Terminal  ileum Tumor Size: 4 mm Tumor Focality: Unifocal, see note Histologic Type and Grade: Low-grade/well-differentiated neuroendocrine tumor (G1)      Mitotic Rate: Essentially 0      Ki-67 Labeling Index: 1% Tumor Extension: Present in lamina propria and submucosa Lymphovascular Invasion: Equivocal Large Mesenteric Ma sses (>2 cm): Not identified Margins: Tanner margins negative for tumor Regional Lymph Nodes:      Number of Lymph Nodes with Tumor: 1      Number of Lymph Nodes Examined: 31 Distant metastasis:      Distant Site(s) Involved: N/A Pathologic Stage Classification (pTNM, AJCC 8th Edition): pT1, pN1 Ancillary Studies: Can be performed upon request Representative Tumor Block: A13 Comment(s): See note above (v1.1.0.0)    GROSS DESCRIPTION:  A. Received fresh labeled with the patients name and Right colon. Specimen: terminal ileum - 10.3 cm long, 3.6 cm circumference, cecum - 8.1 cm long, 12.3 cm circumference, and ascending/transverse colon - 41.1 cm long, 6.0 cm circumference. Appendix is surgically absent, consistent with patient history. Serosa: red-tan with few adhesions along the length. Mucosa: two superficial polypoid lesions are identified, each >10 cm from the proximal (green) and distal (red) margins and 5.1 cm from the mesocolonic resection margin (orange).       Ileocecal valve - a 1.7 x 0.8 x 0.2 cm sessile lesion.      Cecum - a 0.7 x 0.7 x 0.4 cm fungating lesion. A well-circumscribed 4 mm mucosal terminal ileal lesion is found with a tan-yellow appearance Lymph nodes: in the attached fibrofatty tissue are 31 tan, rubbery possible lymph nodes ranging from 0.2-0.9 cm in greatest dimension. Block Summary A1: resection margins, en face A2-A3: entire ileocecal valve lesion A4: entire cecal lesion A5: red-brown discolored area A6: six whole nodes A7: six whole nodes A8: six whole nodes A9: six whole nodes A10: three whole nodes A11: two sectioned nodes,  one inked black A12: two sectioned nodes, one inked black A13: Terminal ileal mucosal lesion  Final Diagnosis performed by Mark LeGolvan DO.   Electronically signed 06/26/2024 Technical and / or Professional components performed at [facility], [address], [place], [place] [address].  Immunohistochemistry Technical component (if applicable) w as performed at [facility]. [address], [place], [place] [address].   IMMUNOHISTOCHEMISTRY DISCLAIMER (if applicable): Some of these immunohistochemical stains may have been developed and the performance characteristics determine by [facility]. Some may not have been cleared or approved by the U.S. Food and Drug Administration. The FDA has determined that such clearance or approval is not necessary. This test is used for clinical purposes. It should not be regarded as investigational or for research. This laboratory is certified under the Clinical Laboratory Improvement Amendments of 1988 (CLIA-88) as qualified to perform high complexity clinical laboratory testing.  The controls stained appropriately.   IHC stains are performed on formalin fixed, paraffin embedded tissue using a 3,3diaminobenzidine (DAB) chromogen and  Leica Bond Autostainer System. The staining intensity of the nucleus is score manually and is reported as t he percentage of tumor cell nuclei demonstrating specific nuclear staining. The specimens are fixed in 10% Neutral Formalin for at least 6 hours and up to 72hrs. These tests are validated on decalcified tissue. Results should be interpreted with caution given the possibility of false negative results on decalcified specimens. Antibody Clones are as follows ER-clone 67F, PR-clone 16, Ki67- clone MM1. Some of these immunohistochemical stains may have been developed and the performance characteristics determined by [facility].   Basic metabolic  panel     Status: Abnormal   Collection Time: 06/21/24  5:07 AM  Result Value Ref Range   Sodium 139 135 - 145 mmol/L   Potassium 3.6 3.5 - 5.1 mmol/L   Chloride 103 98 - 111 mmol/L   CO2 26 22 - 32 mmol/L   Glucose, Bld 122 (H) 70 - 99 mg/dL    Comment: Glucose reference range applies only to samples taken after fasting for at least 8 hours.   BUN 13 8 - 23 mg/dL   Creatinine, Ser 9.06 0.44 - 1.00 mg/dL   Calcium  9.4 8.9 - 10.3 mg/dL   GFR, Estimated >39 >39 mL/min    Comment: (NOTE) Calculated using the CKD-EPI Creatinine Equation (2021)    Anion gap 11 5 - 15    Comment: Performed at [facility], [address], [place], [place] [address]  CBC     Status: Abnormal   Collection Time: 06/21/24  5:07 AM  Result Value Ref Range   WBC 9.6 4.0 - 10.5 K/uL   RBC 4.03 3.87 - 5.11 MIL/uL   Hemoglobin 11.5 (L) 12.0 - 15.0 g/dL   HCT 66.2 (L) 63.9 - 53.9 %   MCV 83.6 80.0 - 100.0 fL   MCH 28.5 26.0 - 34.0 pg   MCHC 34.1 30.0 - 36.0 g/dL   RDW 86.7 88.4 - 84.4 %   Platelets 162 150 - 400 K/uL   nRBC 0.0 0.0 - 0.2 %    Comment: Performed at [facility], [address] [place], [place] [address]  Magnesium     Status: None   Collection Time: 06/21/24  5:07 AM  Result Value Ref Range   Magnesium 2.0 1.7 - 2.4 mg/dL    Comment: Performed at [facility], [address], [place], [place] [address]  Chromogranin A     Status: None   Collection Time: 07/16/24 11:59 AM  Result Value Ref Range   Chromogranin A (ng/mL) 74.8 0.0 - 101.8 ng/mL    Comment: (NOTE) Chromogranin A performed by Thermofisher/BRAHMS KRYPTOR methodology Values obtained with different assay methods or kits cannot be used interchangeably. Performed At: [facility] [address] [place], [place] [id number] Jennette Shorter MD Ey:[id number]   Lactate dehydrogenase     Status: None   Collection Time: 07/16/24 11:59 AM  Result Value Ref Range   LDH  194 105 - 235 U/L    Comment: Performed at [facility], [address], [place], [place] [address]  CMP (Cancer [facility]anner only)     Status: Abnormal   Collection Time: 07/16/24 11:59 AM  Result Value Ref Range   Sodium 139 135 - 145 mmol/L   Potassium 3.7 3.5 - 5.1 mmol/L   Chloride 103 98 - 111 mmol/L   CO2 27 22 - 32 mmol/L   Glucose, Bld 109 (H) 70 -  99 mg/dL    Comment: Glucose reference range applies only to samples taken after fasting for at least 8 hours.   BUN 14 8 - 23 mg/dL   Creatinine 9.23 9.55 - 1.00 mg/dL   Calcium  10.4 (H) 8.9 - 10.3 mg/dL   Total Protein 7.7 6.5 - 8.1 g/dL   Albumin 4.6 3.5 - 5.0 g/dL   AST 26 15 - 41 U/L   ALT 23 0 - 44 U/L   Alkaline Phosphatase 71 38 - 126 U/L   Total Bilirubin 0.3 0.0 - 1.2 mg/dL   GFR, Estimated >39 >39 mL/min    Comment: (NOTE) Calculated using the CKD-EPI Creatinine Equation (2021)    Anion gap 9 5 - 15    Comment: Performed at [facility], [address] [place], [place] [address]  CBC with Differential ([facility] Only)     Status: None   Collection Time: 07/16/24 11:59 AM  Result Value Ref Range   WBC Count 4.5 4.0 - 10.5 K/uL   RBC 4.76 3.87 - 5.11 MIL/uL   Hemoglobin 13.2 12.0 - 15.0 g/dL   HCT 59.5 63.9 - 53.9 %   MCV 84.9 80.0 - 100.0 fL   MCH 27.7 26.0 - 34.0 pg   MCHC 32.7 30.0 - 36.0 g/dL   RDW 85.5 88.4 - 84.4 %   Platelet Count 210 150 - 400 K/uL   nRBC 0.0 0.0 - 0.2 %   Neutrophils Relative % 54 %   Neutro Abs 2.4 1.7 - 7.7 K/uL   Lymphocytes Relative 33 %   Lymphs Abs 1.5 0.7 - 4.0 K/uL   Monocytes Relative 8 %   Monocytes Absolute 0.4 0.1 - 1.0 K/uL   Eosinophils Relative 4 %   Eosinophils Absolute 0.2 0.0 - 0.5 K/uL   Basophils Relative 1 %   Basophils Absolute 0.0 0.0 - 0.1 K/uL   Immature Granulocytes 0 %   Abs Immature Granulocytes 0.00 0.00 - 0.07 K/uL    Comment: Performed at [facility], [address],  [place], [place] [address]     RADIOGRAPHIC STUDIES:  I have personally reviewed the radiological images as listed and agree with the findings in the report.  NM PET DOTATATE SKULL BASE TO MID THIGH EXAM: PET AND CT SKULL BASE TO MID THIGH 07/29/2024 05:13:14 PM  TECHNIQUE:  RADIOPHARMACEUTICAL: 4.07 mCi Copper -64 DOTATATE Uptake time 60 minutes.  PET imaging was acquired from the base of the skull to the mid thighs. Non-contrast enhanced computed tomography was obtained for attenuation correction and anatomic localization.  COMPARISON: None available.  CLINICAL HISTORY: Newly diagnosed neuroendocrine tumor of colon. Needs staging.  FINDINGS:  HEAD AND NECK: Physiologic activity within the pituitary gland, salivary glands, and thyroid gland. No DOTATATE-avid cervical lymphadenopathy.  CHEST: No DOTATATE-avid pulmonary nodules or lymphadenopathy. No pulmonary metastasis.  ABDOMEN AND PELVIS: Post right hemicolectomy anatomy. No DOTATATE-avid metastatic deposits in the mesentery. No DOTATATE-avid abdominopelvic lymph nodes. No abnormal tracer activity within the liver. Post cholecystectomy.  BONES AND SOFT TISSUE: No DOTATATE activity within the bones. No skeletal metastasis.  IMPRESSION: 1. No evidence of DOTATATE-avid disease.  Electronically signed by: Norleen Boxer MD 07/30/2024 09:17 AM EST RP Workstation: [id number]    CODE STATUS:  Code Status History     Date Active Date Inactive Code Status Order ID Comments User Context   06/20/2024 2045 06/24/2024 1500 Full Code [id number]  Sheldon Standing, MD Inpatient    Questions for  Most Recent Historical Code Status (Order [id number])     Question Answer   By: Consent: discussion documented in EHR            Orders Placed This Encounter  Procedures   CT CHEST ABDOMEN PELVIS W CONTRAST    Standing Status:   Future    Expected Date:   12/03/2024    Expiration Date:   08/14/2025    If indicated for the ordered  procedure, I authorize the administration of contrast media per Radiology protocol:   Yes    Does the patient have a contrast media/X-ray dye allergy?:   No    Preferred imaging location?:   [facility]    If indicated for the ordered procedure, I authorize the administration of oral contrast media per Radiology protocol:   Yes   CBC with Differential ([facility] Only)    Standing Status:   Future    Expiration Date:   08/13/2025   CMP ([facility] only)    Standing Status:   Future    Expiration Date:   08/13/2025   Lactate dehydrogenase    Standing Status:   Future    Expiration Date:   08/13/2025   Chromogranin A    Standing Status:   Future    Expiration Date:   08/13/2025     Future Appointments  Date Time Provider Department [facility]  12/11/2024  8:00 AM [facility] None  12/11/2024  8:30 AM Jayla Mackie, [place], MD [facility] None     This document was completed utilizing speech recognition software. Grammatical errors, random word insertions, pronoun errors, and incomplete sentences are an occasional consequence of this system due to software limitations, ambient noise, and hardware issues. Any formal questions or concerns about the content, text or information contained within the body of this dictation should be directly addressed to the provider for clarification.   "

## 2024-08-14 ENCOUNTER — Encounter: Payer: Self-pay | Admitting: Oncology

## 2024-08-14 NOTE — Progress Notes (Signed)
 PATIENT NAVIGATOR PROGRESS NOTE  Name: Katie [facility] Date: 08/14/2024 MRN: [id number]  DOB: [birth year]   Patient is established with a treatment plan and is actively engaged in care. Nurse Navigator services not currently indicated at this time. Will re-evaluate if needs change or if additional support is requested.

## 2024-08-14 NOTE — Assessment & Plan Note (Signed)
 Please review oncology history for additional details and timeline of events.  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   Pathology revealed one positive lymph node out of thirty-one, a low Ki-67 proliferation index (1%), and a mitotic rate of zero, consistent with a low risk profile.  She remains asymptomatic except for mild post-surgical diarrhea, with no evidence of carcinoid syndrome or other concerning symptoms.  Prognosis is excellent.  On her consultation with us  on 07/16/2024, request placed for PET dotatate scan and chromogranin A.  Chromogranin A was normal at 74.8 ng/mL.  On 07/29/2024, PET dotatate scan showed no evidence of dotatate avid disease.  Plan to continue surveillance as per NCCN guidelines.  - Planned ongoing surveillance with CT scans every 3-6 months after initial PET scan, reserving PET scan for suspicious CT findings. - Recommended repeat colonoscopy every three years, given her family history of polyps, unless otherwise directed by gastroenterology. - Provided education regarding symptoms of carcinoid syndrome (wheezing, chest congestion, palpitations, flushing, severe diarrhea) and instructed her to report these if she develops. - Advised no dietary or activity restrictions; encouraged a diet high in fruits and vegetables, moderation of red meat, and avoidance of greasy or spicy foods due to post-surgical diarrhea.  - Ordered surveillance CT scan in 3-4 months.  - Planned annual surveillance imaging after initial follow-up period. - Recommended laboratory evaluation (chromogranin A and chemistries) at time of next scan and at follow-up visits.  - Cleared her to continue Tremfya for psoriasis, with no contraindications from an oncology perspective.  - Planned follow-up in 4 months to review imaging and laboratory studies, including chromogranin A and  chemistries.

## 2024-12-11 ENCOUNTER — Inpatient Hospital Stay: Admitting: Oncology

## 2024-12-11 ENCOUNTER — Inpatient Hospital Stay: Attending: Oncology
